# Patient Record
Sex: Female | Born: 1970 | Race: White | Hispanic: No | Marital: Single | Smoking: Heavy tobacco smoker
Health system: Southern US, Academic
[De-identification: ages and names within clinical notes are randomized; demographics above are authoritative.]

## PROBLEM LIST (undated history)

## (undated) HISTORY — PX: LEG SURGERY: SHX1003

## (undated) HISTORY — PX: BREAST LUMPECTOMY: SHX2

## (undated) HISTORY — PX: TUBAL LIGATION: SHX77

## (undated) HISTORY — PX: HX BREAST LUMPECTOMY: SHX2

## (undated) HISTORY — PX: CLOSED REDUCTION TIBIAL FRACTURE: SHX1361

---

## 1999-02-25 ENCOUNTER — Emergency Department (HOSPITAL_COMMUNITY): Admission: EM | Admit: 1999-02-25 | Discharge: 1999-02-25 | Payer: Self-pay | Admitting: Emergency Medicine

## 1999-03-24 ENCOUNTER — Encounter: Payer: Self-pay | Admitting: Emergency Medicine

## 1999-03-24 ENCOUNTER — Emergency Department (HOSPITAL_COMMUNITY): Admission: EM | Admit: 1999-03-24 | Discharge: 1999-03-24 | Payer: Self-pay | Admitting: Emergency Medicine

## 1999-05-14 ENCOUNTER — Encounter: Payer: Self-pay | Admitting: Emergency Medicine

## 1999-05-14 ENCOUNTER — Emergency Department (HOSPITAL_COMMUNITY): Admission: EM | Admit: 1999-05-14 | Discharge: 1999-05-14 | Payer: Self-pay | Admitting: Emergency Medicine

## 1999-05-15 ENCOUNTER — Emergency Department (HOSPITAL_COMMUNITY): Admission: EM | Admit: 1999-05-15 | Discharge: 1999-05-15 | Payer: Self-pay | Admitting: Emergency Medicine

## 1999-05-18 ENCOUNTER — Encounter: Payer: Self-pay | Admitting: Emergency Medicine

## 1999-05-19 ENCOUNTER — Inpatient Hospital Stay (HOSPITAL_COMMUNITY): Admission: EM | Admit: 1999-05-19 | Discharge: 1999-05-25 | Payer: Self-pay | Admitting: Emergency Medicine

## 1999-05-26 ENCOUNTER — Emergency Department (HOSPITAL_COMMUNITY): Admission: EM | Admit: 1999-05-26 | Discharge: 1999-05-26 | Payer: Self-pay | Admitting: Emergency Medicine

## 1999-06-02 ENCOUNTER — Emergency Department (HOSPITAL_COMMUNITY): Admission: EM | Admit: 1999-06-02 | Discharge: 1999-06-02 | Payer: Self-pay | Admitting: Emergency Medicine

## 1999-09-28 ENCOUNTER — Emergency Department (HOSPITAL_COMMUNITY): Admission: EM | Admit: 1999-09-28 | Discharge: 1999-09-28 | Payer: Self-pay | Admitting: Emergency Medicine

## 2000-02-06 ENCOUNTER — Emergency Department (HOSPITAL_COMMUNITY): Admission: EM | Admit: 2000-02-06 | Discharge: 2000-02-07 | Payer: Self-pay | Admitting: Emergency Medicine

## 2000-02-07 ENCOUNTER — Encounter: Payer: Self-pay | Admitting: Emergency Medicine

## 2001-01-26 ENCOUNTER — Emergency Department (HOSPITAL_COMMUNITY): Admission: EM | Admit: 2001-01-26 | Discharge: 2001-01-26 | Payer: Self-pay | Admitting: Emergency Medicine

## 2001-01-27 ENCOUNTER — Encounter: Payer: Self-pay | Admitting: Emergency Medicine

## 2001-03-13 ENCOUNTER — Emergency Department (HOSPITAL_COMMUNITY): Admission: EM | Admit: 2001-03-13 | Discharge: 2001-03-13 | Payer: Self-pay | Admitting: Emergency Medicine

## 2001-03-13 ENCOUNTER — Encounter: Payer: Self-pay | Admitting: Emergency Medicine

## 2003-06-13 ENCOUNTER — Emergency Department (HOSPITAL_COMMUNITY): Admission: EM | Admit: 2003-06-13 | Discharge: 2003-06-13 | Payer: Self-pay | Admitting: Emergency Medicine

## 2003-07-12 ENCOUNTER — Emergency Department (HOSPITAL_COMMUNITY): Admission: EM | Admit: 2003-07-12 | Discharge: 2003-07-12 | Payer: Self-pay | Admitting: Emergency Medicine

## 2003-10-17 ENCOUNTER — Inpatient Hospital Stay (HOSPITAL_COMMUNITY): Admission: EM | Admit: 2003-10-17 | Discharge: 2003-10-21 | Payer: Self-pay | Admitting: Psychiatry

## 2004-01-16 ENCOUNTER — Inpatient Hospital Stay (HOSPITAL_COMMUNITY): Admission: AD | Admit: 2004-01-16 | Discharge: 2004-01-24 | Payer: Self-pay | Admitting: *Deleted

## 2004-05-12 ENCOUNTER — Emergency Department (HOSPITAL_COMMUNITY): Admission: EM | Admit: 2004-05-12 | Discharge: 2004-05-12 | Payer: Self-pay | Admitting: Emergency Medicine

## 2004-05-23 ENCOUNTER — Emergency Department (HOSPITAL_COMMUNITY): Admission: EM | Admit: 2004-05-23 | Discharge: 2004-05-23 | Payer: Self-pay | Admitting: Emergency Medicine

## 2005-01-14 ENCOUNTER — Emergency Department (HOSPITAL_COMMUNITY): Admission: EM | Admit: 2005-01-14 | Discharge: 2005-01-14 | Payer: Self-pay | Admitting: Emergency Medicine

## 2005-01-28 ENCOUNTER — Ambulatory Visit: Payer: Self-pay | Admitting: Psychiatry

## 2005-01-28 ENCOUNTER — Inpatient Hospital Stay (HOSPITAL_COMMUNITY): Admission: RE | Admit: 2005-01-28 | Discharge: 2005-02-01 | Payer: Self-pay | Admitting: Psychiatry

## 2009-03-01 ENCOUNTER — Emergency Department (HOSPITAL_COMMUNITY): Admission: EM | Admit: 2009-03-01 | Discharge: 2009-03-01 | Payer: Self-pay | Admitting: Emergency Medicine
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
## 2011-04-02 NOTE — Discharge Summary (Signed)
NAME:  Danielle Morales, Danielle Morales                     ACCOUNT NO.:  [id number]   MEDICAL RECORD NO.:  [id number]                   PATIENT TYPE:  IPS   LOCATION:  0503                                 FACILITY:  [facility]   PHYSICIAN:  Geoffery Lyons, M.D.                   DATE OF BIRTH:  1971/08/26   DATE OF ADMISSION:  10/17/2003  DATE OF DISCHARGE:  10/21/2003                                 DISCHARGE SUMMARY   CHIEF COMPLAINT AND PRESENTING ILLNESS:  This was the first admission to  [facility]  for this 40 year old white female, single,  voluntarily admitted.  Seven months pregnant.  Suicidal thoughts with a plan  to hang herself.  History of prior attempts.  History of drug use since age  26, longest clean and sober 11 years.  Relapsed 2-1/2 years ago, only using  cocaine.  Had given up her children to [employer] in [place].  Now using cocaine  daily, with escalation since February 2004, $100 a day.  Feeling hopeless,  suicidal, with thoughts of hanging herself. Ruining her life financially and  feeling hopeless about her future.  Endorsed suicidal ideas.  Endorsed  anhedonia.  Feelings of financial stress.   PAST PSYCHIATRIC HISTORY:  [facility] but  noncompliant.  Hospitalized 3-4 times in the past [facility] [place] [facility].  Previously on Zoloft, Depakote, trazodone and Benadryl for  anxiety.   ALCOHOL AND DRUG HISTORY:  History of alcohol and cocaine abuse.   PAST MEDICAL HISTORY:  Pregnant 7 months, due date March 22.  History of  psychosis and transient ischemic attack in 1992 attributed to cocaine.   MEDICATIONS:  Nitrofurantoin 100 mg twice a day, Zoloft, Depakote, trazodone  not taking.   PHYSICAL EXAMINATION:  Performed, failed to show any acute findings other  than edema in her extremities.   MENTAL STATUS EXAM:  Reveals a fully alert female, fair affect, cooperative,  calm, good eye focus.  Normal in pace and tone.  Mood is  depressed and  helpless.  Thought process positive for suicidal ideas, no agitation, no  flight of ideas, no evidence of psychosis.  No homicidal ideas, no paranoia.  Cognition well preserved.   ADMISSION DIAGNOSES:   AXIS I:  1. Major depression, recurrent.  2. Cocaine abuse.   AXIS II:  No diagnosis.   AXIS III:  Seven month pregnancy, acute urinary tract infection.   AXIS IV:  Moderate.   AXIS V:  Global assessment of function upon admission 36, highest global  assessment of function in past year 59.   LABORATORY DATA:  CBC:  Hemoglobin was 11.2.  Blood chemistries were within  normal limits.  Thyroid profile was within normal limits.  Urine culture  negative.   COURSE IN HOSPITAL:  She was admitted and started on intensive individual  and group psychotherapy.  We maintained the Nitrofurantoin 100 mg twice  a  day for 7 days.  We started Zoloft 25 mg per day.  She was given Benadryl  for anxiety.  Long history of depression and mood disorder, having her 6th  child, gave the last one up for adoption, counseling.  Headache, cravings  for cigarettes.  Extensive history of substance use, was still wanting to  have the baby.  Some headaches, __________.  She was given some Midrin.  Apparently had a visit with her boyfriend, was supportive.  Mom was also  supportive.  The fact that they were supportive made her feel better.  She  was willing to work on long-term abstinence, and she started feeling better.  Her mood improved, her affect became brighter, broad.  So on December 6, she  was in full contact with reality.  Mood:  I am happy, dealing much better  with the situation.  Denied any suicidal or homicidal ideas, no plans, no  evidence of hallucinations, no delusions.  Bright, broad, positive about her  life, about the support that she was getting from her family.  Willing and  motivated to pursue further outpatient treatment.   DISCHARGE DIAGNOSES:   AXIS I:  1. Major  depression.  2. Cocaine abuse.   AXIS II:  No diagnosis.   AXIS III:  Intrauterine pregnancy, acute urinary tract infection.   AXIS IV:  Moderate.   AXIS V:  Global assessment of function upon discharge 50-55.   DISCHARGE MEDICATIONS:  1. Prenatal vitamins.  2. Zoloft 50 mg per day.  3. Macrodantin 100 twice a day.  4. Mellaril 25 every 6 hours as needed for anxiety.   DISPOSITION:  Follow up at [facility] clinic, [facility]                                               Geoffery Lyons, M.D.    IL/MEDQ  D:  11/01/2003  T:  11/02/2003  Job:  [id number]

## 2011-04-02 NOTE — Op Note (Signed)
NAME:  Danielle Morales, Danielle Morales                     ACCOUNT NO.:  [id number]   MEDICAL RECORD NO.:  [id number]                   PATIENT TYPE:  INP   LOCATION:  9134                                 FACILITY:  [facility]   PHYSICIAN:  Tanya S. Shawnie Pons, M.D.                DATE OF BIRTH:  10-Aug-1971   DATE OF PROCEDURE:  01/23/2004  DATE OF DISCHARGE:                                 OPERATIVE REPORT   </PREOPERATIVE DIAGNOSES>  1. Multiparity.  2. Undesired fertility.    POSTOPERATIVE DIAGNOSES:  1. Multiparity.  2. Undesired fertility.   PROCEDURE:  Postpartum bilateral tubal ligation with Filshie clips.   SURGEON:  Shelbie Proctor. Shawnie Pons, M.D.   ANESTHESIA:  General endotracheal tube with Raul Del, M.D.   ESTIMATED BLOOD LOSS:  Less than 25 mL.   COMPLICATIONS:  None.   SPECIMENS:  None.   REASON FOR PROCEDURE:  Briefly, the patient is a 40 year old gravida 9, para  6-0-3-6, who is postpartum day 1 from a spontaneous vaginal delivery, whose  pregnancy was complicated by cocaine use and abruption, who desired  permanent sterility.  Prior to the procedure the patient was counseled  regarding risks and benefits of this procedure, including alternatives.  She  was counseled about the permanency of the procedure, risk of failure of one  in 200, increased risk of ectopic should failure occur.  The patient  understood these risks and agreed to proceed.   DESCRIPTION OF PROCEDURE:  The patient was taken to the OR, where she was  placed in the supine position and anesthesia was administered.  She was then  prepped and draped in the usual sterile fashion and 4 mL of 0.25% Marcaine  were injected infraumbilically.  Two Allis clamps were used to elevate prior  to the incision and a 2 cm was made on her infraumbilically.  It was carried  down to the underlying fascia and the peritoneal cavity entered sharply.  [employer] retractors were then used as well as Trendelenburg and a sponge  pad to move  omentum and bowel, which were in the way from finding the tube.  The cornu of the uterus was then grasped with a Babcock clamp and the tube  visualized and grasped and followed to its fimbriated end.  Approximately  1.5 cm from the cornu a Filshie clip was placed across this tube.  Similarly  the right tube was identified and followed to its fimbriated end  approximately 1.5 cm from the cornu.  A Filshie clip was placed across the  tube.  Both tubes were allowed to return  to the abdominal cavity.  The fascia was closed with #1 Vicryl suture in a  running fashion, the skin closed with 4-0 Vicryl in a subcutaneous fashion.  All instrument, needle, and lap counts were correct x2.  The patient was  awakened and taken to the recovery room in stable condition.  Shelbie Proctor. Shawnie Pons, M.D.    TSP/MEDQ  D:  01/23/2004  T:  01/24/2004  Job:  [id number]

## 2011-04-02 NOTE — Discharge Summary (Signed)
NAME:  Danielle Morales, Danielle Morales                     ACCOUNT NO.:  [id number]   MEDICAL RECORD NO.:  [id number]                   PATIENT TYPE:  INP   LOCATION:  9134                                 [facility]ACILITY:  [facility]   PHYSICIAN:  Lesly Dukes, M.D.              DATE OF BIRTH:  Mar 10, 1971   DATE OF ADMISSION:  01/16/2004  DATE OF DISCHARGE:  01/24/2004                                 DISCHARGE SUMMARY   ADMISSION DIAGNOSIS:  Vaginal bleeding.   HISTORY OF PRESENT ILLNESS:  This is a 40 year old G9 P6-0-3-6 white female  at 61 and two-sevenths estimated gestational age by last menstrual period  who is admitted for observation after presenting with vaginal bleeding and a  positive urine drug screen for cocaine.  Complaint of severe abdominal pain  and suspicion for marginal placental separation kept the patient in-house  for a number of days for observation.  In addition there was concern that  sending the patient from the hospital would lead to further drug use and  ultimately in demise of the unborn child.  The patient had a workup for GI  pathology given the complaint of abdominal pain.  This workup was negative.  The patient also had a psychiatry consult given her positive past medical  history of bipolar disorder and suicidal ideation.  The patient was seen by  Dr. Antonietta Breach who diagnosed the patient with DSM-IV axis I diagnosis  of bipolar I disorder, depression in remission, and polysubstance  dependence.  He recommended daily use of Zoloft and Depakote and p.r.n. use  of trazodone after delivery for insomnia.  The patient's LMP was uncertain  and as such we were struggling with the patient's actual dating.  Amniocentesis was attempted to assess fetal lung maturity but once the  patient was on the table was felt to be too high risk, therefore was not  performed.  On hospital day #6 the patient complained of contractions and  progressed rapidly to spontaneous vaginal delivery of  a female with Apgars  of 3 and 9 at one and five minutes respectively.  A spontaneous, intact,  three-vessel-cord placenta was delivered shortly thereafter.  On postpartum  day #1 the patient had a laparoscopic bilateral tubal ligation without  complication.  On hospital day #7 the patient met with the Department of  Social Services regarding her history of drug abuse and her psychiatric  history as it related to the safety of the newborn.  It was determined that  the patient may return home with the child as long as the patient's sister  was directly involved in the newborn's care.  In addition, the Department of  Social Services would be making at least weekly visits to the house and the  patient will have to check in at least twice weekly with the Department of  Social Services to ensure consistent and healthy parenting.  The patient may  also be subject to random  drug testing.  On the day of discharge the patient  complained of shortness of air and calf pain, had slight reduced breath  sounds in the right lung field. Given the patient's history of being  bedridden for a week, along with history of smoking and recent pregnancy, a  spiral CT was performed to rule out CT.  This was negative.  The patient  also complained of dysuria on the day of discharge, had a UA which was read  as specific gravity 1.020, pH 6.5, blood large, nitrite positive, few  squamous cells, 3-6 white blood cells per high-power field, many bacteria  per high-power field.  The patient was given a prescription for Macrobid 100  mg p.o. b.i.d. x7 days.  The patient was also given ibuprofen 600 mg p.o.  q.6h. p.r.n. for pain.  Hemoglobin on postpartum day #1 was 10.4.  The  patient is rubella immune, A positive, bottle feeding.  Was to follow up in  6 weeks at [facility].     Franklyn Lor, MD                            Lesly Dukes, M.D.    TD/MEDQ  D:  01/24/2004  T:  01/25/2004  Job:  [id number]
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
## 2016-12-06 ENCOUNTER — Emergency Department (HOSPITAL_COMMUNITY)
Admission: EM | Admit: 2016-12-06 | Discharge: 2016-12-06 | Disposition: A | Discharge status: Home / Self Care | Payer: Self-pay | Attending: Emergency Medicine | Admitting: Emergency Medicine

## 2016-12-06 ENCOUNTER — Encounter (HOSPITAL_COMMUNITY): Payer: Self-pay | Admitting: Emergency Medicine

## 2016-12-06 DIAGNOSIS — G8929 Other chronic pain: Secondary | ICD-10-CM

## 2016-12-06 DIAGNOSIS — R51 Headache: Secondary | ICD-10-CM | POA: Insufficient documentation

## 2016-12-06 DIAGNOSIS — M546 Pain in thoracic spine: Secondary | ICD-10-CM | POA: Insufficient documentation

## 2016-12-06 DIAGNOSIS — F17210 Nicotine dependence, cigarettes, uncomplicated: Secondary | ICD-10-CM | POA: Insufficient documentation

## 2016-12-06 DIAGNOSIS — R519 Headache, unspecified: Secondary | ICD-10-CM

## 2016-12-06 DIAGNOSIS — M549 Dorsalgia, unspecified: Secondary | ICD-10-CM

## 2016-12-06 MED ORDER — KETOROLAC TROMETHAMINE 60 MG/2ML IM SOLN
60.0000 mg | Freq: Once | INTRAMUSCULAR | Status: AC
  Administered 2016-12-06: 60 mg via INTRAMUSCULAR
  Filled 2016-12-06: qty 2

## 2016-12-06 MED ORDER — PROCHLORPERAZINE MALEATE 10 MG PO TABS
10.0000 mg | ORAL_TABLET | Freq: Once | ORAL | Status: AC
  Administered 2016-12-06: 10 mg via ORAL
  Filled 2016-12-06: qty 1

## 2016-12-06 MED ORDER — DIPHENHYDRAMINE HCL 25 MG PO CAPS
25.0000 mg | ORAL_CAPSULE | Freq: Once | ORAL | Status: AC
  Administered 2016-12-06: 25 mg via ORAL
  Filled 2016-12-06: qty 1

## 2016-12-06 NOTE — Discharge Instructions (Signed)
Read the information below.  Use the prescribed medication as directed.  Please discuss all new medications with your pharmacist.  You may return to the Emergency Department at any time for worsening condition or any new symptoms that concern you.   ° °You are having a headache. No specific cause was found today for your headache. It may have been a migraine or other cause of headache. Stress, anxiety, fatigue, and depression are common triggers for headaches. Your headache today does not appear to be life-threatening or require hospitalization, but often the exact cause of headaches is not determined in the emergency department. Therefore, follow-up with your doctor is very important to find out what may have caused your headache, and whether or not you need any further diagnostic testing or treatment. Sometimes headaches can appear benign (not harmful), but then more serious symptoms can develop which should prompt an immediate re-evaluation by your doctor or the emergency department. °SEEK MEDICAL ATTENTION IF: °You develop possible problems with medications prescribed.  °The medications don't resolve your headache, if it recurs , or if you have multiple episodes of vomiting or can't take fluids. °You have a change from the usual headache. °RETURN IMMEDIATELY IF you develop a sudden, severe headache or confusion, become poorly responsive or faint, develop a fever above 100.4F or problem breathing, have a change in speech, vision, swallowing, or understanding, or develop new weakness, numbness, tingling, incoordination, or have a seizure. °

## 2016-12-06 NOTE — ED Triage Notes (Addendum)
Pt verbalizes chronic left shoulder pain with acute chronic onset of pain last night at work. Pt continues to report headache onset yesterday. Pt denies CP or SOB.

## 2016-12-06 NOTE — ED Notes (Signed)
No response when called from lobby for triage. 

## 2016-12-06 NOTE — ED Provider Notes (Signed)
[facility]-EMERGENCY DEPT Provider Note   CSN: [id number] Arrival date & time: 12/06/16  1416   By signing my name below, I, Clovis PuAvnee Patel, attest that this documentation has been prepared under the direction and in the presence of  [facility] Center[facility] Lamonta Cypress, PA-C. Electronically Signed: Clovis PuAvnee Patel, ED Scribe. 12/06/16. 3:26 PM.   History   Chief Complaint Chief Complaint  Patient presents with  . Headache  . Shoulder Pain   The history is provided by the patient. No language interpreter was used.   HPI Comments:  Danielle Morales is a 46 y.o. female, with a hx of chronic left upper back pain, who presents to the Emergency Department complaining of a bitemporal headache that began yesterday with associated photophobia, phonophobia.  This feels like prior migraines.  She has also worsening left upper back pain that began in November when she did heavy lifting while moving.  She had a few episodes of nosebleed from the left nare today, each resolved within 10 minutes.  She does not usually have nosebleeds.  She is not on blood thinners.  Denies nasal congestion or sinus pressure.  No alleviating factors noted. Pt denies daily medication use, any recent head trauma, fevers, recent illnesses, congestion, sore throat and any other associated symptoms at this time. No PCP noted. She is allergic to ciprofloxacin.   History reviewed. No pertinent past medical history.  There are no active problems to display for this patient.   Past Surgical History:  Procedure Laterality Date  . BREAST LUMPECTOMY    . LEG SURGERY      OB History    No data available       Home Medications    Prior to Admission medications   Not on File    Family History No family history on file.  Social History Social History  Substance Use Topics  . Smoking status: Current Every Day Smoker    Types: Cigarettes  . Smokeless tobacco: Never Used  . Alcohol use No     Allergies   Ciprofloxacin   Review of  Systems Review of Systems  Constitutional: Negative for fever.  HENT: Positive for nosebleeds. Negative for congestion, facial swelling, sore throat and trouble swallowing.   Eyes: Positive for photophobia.  Musculoskeletal: Positive for neck pain.  Allergic/Immunologic: Negative for immunocompromised state.  Neurological: Positive for headaches.  Hematological: Does not bruise/bleed easily.  Psychiatric/Behavioral: Negative for self-injury.     Physical Exam Updated Vital Signs BP 139/69   Pulse 80   Temp 98 F (36.7 C) (Oral)   Resp 16   LMP 12/06/2016   SpO2 97%   Physical Exam  Constitutional: She appears well-developed and well-nourished. No distress.  HENT:  Head: Normocephalic and atraumatic.  Nose: Mucosal edema present. No epistaxis.  Left anterior nare over septum with superficial vessel that appears to have recently bled.    Neck: Normal range of motion. Neck supple.  Pulmonary/Chest: Effort normal.  Musculoskeletal: She exhibits tenderness.  Left cervical spinal and trapezius muscle TTP. No skin changes and no focal bony tenderness   Neurological: She is alert.  CN II-XII intact, EOMs intact, no pronator drift, grip strengths equal bilaterally; strength 5/5 in all extremities, sensation intact in all extremities; finger to nose, heel to shin, rapid alternating movements normal; gait is normal.   Skin: She is not diaphoretic.  Nursing note and vitals reviewed.    ED Treatments / Results  DIAGNOSTIC STUDIES:  Oxygen Saturation is 97% on RA, normal by  my interpretation.    COORDINATION OF CARE:  3:23 PM Discussed treatment plan with pt at bedside and pt agreed to plan.  Labs (all labs ordered are listed, but only abnormal results are displayed) Labs Reviewed - No data to display  EKG  EKG Interpretation None       Radiology No results found.  Procedures Procedures (including critical care time)  Medications Ordered in ED Medications   ketorolac (TORADOL) injection 60 mg (60 mg Intramuscular Given 12/06/16 1559)  prochlorperazine (COMPAZINE) tablet 10 mg (10 mg Oral Given 12/06/16 1600)  diphenhydrAMINE (BENADRYL) capsule 25 mg (25 mg Oral Given 12/06/16 1600)     Initial Impression / Assessment and Plan / ED Course  I have reviewed the triage vital signs and the nursing notes.  Pertinent labs & imaging results that were available during my care of the patient were reviewed by me and considered in my medical decision making (see chart for details).  Clinical Course as of Dec 06 1724  Mon Dec 06, 2016  1706 Pt reports great improvement of headache.    [EW]    Clinical Course User Index [EW] Trixie Dredge, PA-C    Pt HA treated and improved while in ED.  Presentation is like pts typical migraine HA exacerbated by left upper back and neck pain from muscular injury with heavy lifting in November.  Headache without red flags, not concerning for [facility], ICH, Meningitis, or temporal arteritis. Pt is afebrile with no focal neuro deficits, nuchal rigidity, or change in vision. Pt is to follow up with PCP.  Pt verbalizes understanding and is agreeable with plan to dc.   Discussed result, findings, treatment, and follow up  with patient.  Pt given return precautions.  Pt verbalizes understanding and agrees with plan.       Final Clinical Impressions(s) / ED Diagnoses   Final diagnoses:  Acute nonintractable headache, unspecified headache type  Chronic upper back pain    New Prescriptions There are no discharge medications for this patient.   I personally performed the services described in this documentation, which was scribed in my presence. The recorded information has been reviewed and is accurate.     Trixie Dredge, PA-C 12/06/16 1726    Derwood Kaplan, MD 12/07/16 1551

## 2017-04-05 ENCOUNTER — Encounter (HOSPITAL_COMMUNITY): Payer: Self-pay | Admitting: Emergency Medicine

## 2017-04-05 ENCOUNTER — Emergency Department (HOSPITAL_COMMUNITY): Payer: Self-pay

## 2017-04-05 ENCOUNTER — Other Ambulatory Visit: Payer: Self-pay

## 2017-04-05 ENCOUNTER — Emergency Department (HOSPITAL_COMMUNITY)
Admission: EM | Admit: 2017-04-05 | Discharge: 2017-04-05 | Disposition: A | Discharge status: Home / Self Care | Payer: Self-pay | Attending: Physician Assistant | Admitting: Physician Assistant

## 2017-04-05 DIAGNOSIS — N390 Urinary tract infection, site not specified: Secondary | ICD-10-CM | POA: Insufficient documentation

## 2017-04-05 DIAGNOSIS — R739 Hyperglycemia, unspecified: Secondary | ICD-10-CM | POA: Insufficient documentation

## 2017-04-05 DIAGNOSIS — R1013 Epigastric pain: Secondary | ICD-10-CM

## 2017-04-05 DIAGNOSIS — Z79899 Other long term (current) drug therapy: Secondary | ICD-10-CM | POA: Insufficient documentation

## 2017-04-05 DIAGNOSIS — K920 Hematemesis: Secondary | ICD-10-CM | POA: Insufficient documentation

## 2017-04-05 DIAGNOSIS — F17210 Nicotine dependence, cigarettes, uncomplicated: Secondary | ICD-10-CM | POA: Insufficient documentation

## 2017-04-05 LAB — COMPREHENSIVE METABOLIC PANEL
ALBUMIN: 3.7 g/dL (ref 3.5–5.0)
ALK PHOS: 71 U/L (ref 38–126)
ALT: 23 U/L (ref 14–54)
ANION GAP: 9 (ref 5–15)
AST: 20 U/L (ref 15–41)
BUN: 9 mg/dL (ref 6–20)
CALCIUM: 8.9 mg/dL (ref 8.9–10.3)
CHLORIDE: 104 mmol/L (ref 101–111)
CO2: 25 mmol/L (ref 22–32)
CREATININE: 0.73 mg/dL (ref 0.44–1.00)
GFR calc non Af Amer: >60 mL/min (ref >60)
GLUCOSE: 145 mg/dL — AB (ref 65–99)
Potassium: 4 mmol/L (ref 3.5–5.1)
SODIUM: 138 mmol/L (ref 135–145)
Total Bilirubin: 0.4 mg/dL (ref 0.3–1.2)
Total Protein: 7.1 g/dL (ref 6.5–8.1)

## 2017-04-05 LAB — CBC
HEMATOCRIT: 43.7 % (ref 36.0–46.0)
HEMOGLOBIN: 14.4 g/dL (ref 12.0–15.0)
MCH: 29.3 pg (ref 26.0–34.0)
MCHC: 33 g/dL (ref 30.0–36.0)
MCV: 88.8 fL (ref 78.0–100.0)
Platelets: 304 10*3/uL (ref 150–400)
RBC: 4.92 MIL/uL (ref 3.87–5.11)
RDW: 13.9 % (ref 11.5–15.5)
WBC: 8 10*3/uL (ref 4.0–10.5)

## 2017-04-05 LAB — URINALYSIS, ROUTINE W REFLEX MICROSCOPIC
BILIRUBIN URINE: NEGATIVE
GLUCOSE, UA: NEGATIVE mg/dL
HGB URINE DIPSTICK: NEGATIVE
Ketones, ur: NEGATIVE mg/dL
NITRITE: NEGATIVE
PROTEIN: NEGATIVE mg/dL
Specific Gravity, Urine: 1.019 (ref 1.005–1.030)
pH: 7 (ref 5.0–8.0)

## 2017-04-05 LAB — POCT I-STAT TROPONIN I: Troponin i, poc: 0 ng/mL (ref 0.00–0.08)

## 2017-04-05 LAB — OCCULT BLOOD, POC DEVICE: Fecal Occult Bld: NEGATIVE

## 2017-04-05 LAB — LIPASE, BLOOD: LIPASE: 24 U/L (ref 11–51)

## 2017-04-05 MED ORDER — SODIUM CHLORIDE 0.9 % IV BOLUS (SEPSIS)
1000.0000 mL | Freq: Once | INTRAVENOUS | Status: AC
  Administered 2017-04-05: 1000 mL via INTRAVENOUS

## 2017-04-05 MED ORDER — SULFAMETHOXAZOLE-TRIMETHOPRIM 800-160 MG PO TABS: 1.0000 | ORAL_TABLET | Freq: Two times a day (BID) | ORAL | 0 refills | Status: AC

## 2017-04-05 MED ORDER — DEXTROSE 5 % IV SOLN
1.0000 g | Freq: Once | INTRAVENOUS | Status: AC
  Administered 2017-04-05: 1 g via INTRAVENOUS
  Filled 2017-04-05: qty 10

## 2017-04-05 MED ORDER — SUCRALFATE 1 G PO TABS: 1.0000 g | ORAL_TABLET | Freq: Three times a day (TID) | ORAL | 0 refills | Status: AC

## 2017-04-05 MED ORDER — METOCLOPRAMIDE HCL 10 MG PO TABS: 10.0000 mg | ORAL_TABLET | Freq: Three times a day (TID) | ORAL | 0 refills | Status: AC | PRN

## 2017-04-05 MED ORDER — PANTOPRAZOLE SODIUM 20 MG PO TBEC: 20.0000 mg | DELAYED_RELEASE_TABLET | Freq: Every day | ORAL | 0 refills | Status: AC

## 2017-04-05 MED ORDER — PANTOPRAZOLE SODIUM 40 MG IV SOLR
40.0000 mg | Freq: Once | INTRAVENOUS | Status: AC
  Administered 2017-04-05: 40 mg via INTRAVENOUS
  Filled 2017-04-05: qty 40

## 2017-04-05 MED ORDER — ONDANSETRON HCL 4 MG/2ML IJ SOLN
4.0000 mg | Freq: Once | INTRAMUSCULAR | Status: AC
  Administered 2017-04-05: 4 mg via INTRAVENOUS
  Filled 2017-04-05: qty 2

## 2017-04-05 MED ORDER — SUCRALFATE 1 G PO TABS
1.0000 g | ORAL_TABLET | Freq: Once | ORAL | Status: AC
  Administered 2017-04-05: 1 g via ORAL
  Filled 2017-04-05: qty 1

## 2017-04-05 MED ORDER — GI COCKTAIL ~~LOC~~
30.0000 mL | Freq: Once | ORAL | Status: AC
  Administered 2017-04-05: 30 mL via ORAL
  Filled 2017-04-05: qty 30

## 2017-04-05 NOTE — ED Provider Notes (Signed)
[facility]-EMER[facility]ENCY DEPT Provider Note   CSN: [id number] Arrival date & time: 04/05/17  1536     History   Chief Complaint Chief Complaint  Patient presents with  . Abdominal Pain    HPI Danielle Morales is a 46 y.o. female.  HPI   Patient p/w sharp epigastric pain with radiation through to the back that began at 2am today.  Associated nausea.  EMS was called but she refused transport.  Used pressure, movement, meditation with mild improvement.  She slept, woke up and ate - vomited BRB blood with clots for 6-7 minutes. She denies any recent dark stools but does have them occasionally, never BRBPR.  Has had some associated lightheadedness. Denies fevers, CP, SOB, cough, urinary or vaginal symptoms.  Has never had abdominal surgery.  Does not have a PCP.  Does not take any medications, only occasional tylenol.  Occasional ETOH.     History reviewed. No pertinent past medical history.  There are no active problems to display for this patient.   Past Surgical History:  Procedure Laterality Date  . BREAST LUMPECTOMY    . LEG SURGERY      OB History    No data available       Home Medications    Prior to Admission medications   Medication Sig Start Date End Date Taking? Authorizing Provider  metoCLOPramide (REGLAN) 10 MG tablet Take 1 tablet (10 mg total) by mouth every 8 (eight) hours as needed for nausea or vomiting. 04/05/17   Trixie Dredge, PA-C  pantoprazole (PROTONIX) 20 MG tablet Take 1 tablet (20 mg total) by mouth daily. 04/05/17   Trixie Dredge, PA-C  sucralfate (CARAFATE) 1 g tablet Take 1 tablet (1 g total) by mouth 4 (four) times daily -  with meals and at bedtime. 04/05/17   Trixie Dredge, PA-C  sulfamethoxazole-trimethoprim (BACTRIM DS,SEPTRA DS) 800-160 MG tablet Take 1 tablet by mouth 2 (two) times daily. X 3 days 04/05/17   Trixie Dredge, PA-C    Family History No family history on file.  Social History Social History  Substance Use Topics  . Smoking status:  Current Every Day Smoker    Types: Cigarettes  . Smokeless tobacco: Never Used  . Alcohol use No     Allergies   Ciprofloxacin   Review of Systems Review of Systems  All other systems reviewed and are negative.    Physical Exam Updated Vital Signs BP 134/85 (BP Location: Left Arm)   Pulse 70   Temp 97.9 F (36.6 C) (Oral)   Resp 18   LMP 03/16/2017 (Exact Date)   SpO2 98%   Physical Exam  Constitutional: She appears well-developed and well-nourished. No distress.  HENT:  Head: Normocephalic and atraumatic.  Neck: Neck supple.  Cardiovascular: Normal rate and regular rhythm.   Pulmonary/Chest: Effort normal and breath sounds normal. No respiratory distress. She has no wheezes. She has no rales.  Abdominal: Soft. She exhibits no distension. There is tenderness (epigastric). There is no rebound and no guarding.  Neurological: She is alert.  Skin: She is not diaphoretic.  Nursing note and vitals reviewed.    ED Treatments / Results  Labs (all labs ordered are listed, but only abnormal results are displayed) Labs Reviewed  COMPREHENSIVE METABOLIC PANEL - Abnormal; Notable for the following:       Result Value   Glucose, Bld 145 (*)    All other components within normal limits  URINALYSIS, ROUTINE W REFLEX MICROSCOPIC - Abnormal; Notable for  the following:    APPearance HAZY (*)    Leukocytes, UA TRACE (*)    Bacteria, UA MANY (*)    Squamous Epithelial / LPF 0-5 (*)    All other components within normal limits  URINE CULTURE  LIPASE, BLOOD  CBC  I-STAT TROPOININ, ED  POC OCCULT BLOOD, ED  OCCULT BLOOD, POC DEVICE  POCT I-STAT TROPONIN I    EKG  EKG Interpretation None       Radiology Dg Chest 2 View  Result Date: 04/05/2017 CLINICAL DATA:  Chest pain EXAM: CHEST  2 VIEW COMPARISON:  05/26/2016 FINDINGS: The heart size and mediastinal contours are within normal limits. Both lungs are clear. The visualized skeletal structures are unremarkable.  IMPRESSION: No active cardiopulmonary disease. Electronically Signed   By: Marlan Palauharles  Clark M.D.   On: 04/05/2017 16:52   [place] Abdomen Complete  Result Date: 04/05/2017 CLINICAL DATA:  Upper abdominal pain.  Hematemesis EXAM: ABDOMEN ULTRASOUND COMPLETE COMPARISON:  None. FINDINGS: Gallbladder: No gallstones or wall thickening visualized. No sonographic Murphy sign noted by sonographer. Common bile duct: Diameter: 2 mm Liver: Increased echogenicity liver without focal lesion IVC: No abnormality visualized. Pancreas: Visualized portion unremarkable. Spleen: Size and appearance within normal limits. Right Kidney: Length: 13.3 cm. Echogenicity within normal limits. No mass or hydronephrosis visualized. Left Kidney: Length: 14.3 cm. Echogenicity within normal limits. No mass or hydronephrosis visualized. Abdominal aorta: No aneurysm visualized. Other findings: None. IMPRESSION: Negative for gallstones.  No acute abnormality. Increased echogenicity liver without focal liver lesion. Electronically Signed   By: Marlan Palauharles  Clark M.D.   On: 04/05/2017 18:56    Procedures Procedures (including critical care time)  Medications Ordered in ED Medications  sodium chloride 0.9 % bolus 1,000 mL (0 mLs Intravenous Stopped 04/05/17 2009)  pantoprazole (PROTONIX) injection 40 mg (40 mg Intravenous Given 04/05/17 1752)  gi cocktail (Maalox,Lidocaine,Donnatal) (30 mLs Oral Given 04/05/17 1752)  ondansetron (ZOFRAN) injection 4 mg (4 mg Intravenous Given 04/05/17 1752)  cefTRIAXone (ROCEPHIN) 1 g in dextrose 5 % 50 mL IVPB (0 g Intravenous Stopped 04/05/17 2022)  sucralfate (CARAFATE) tablet 1 g (1 g Oral Given 04/05/17 1921)     Initial Impression / Assessment and Plan / ED Course  I have reviewed the triage vital signs and the nursing notes.  Pertinent labs & imaging results that were available during my care of the patient were reviewed by me and considered in my medical decision making (see chart for details).  Clinical  Course as of Apr 05 2302  Tue Apr 05, 2017  1959 Pt feeling better, tolerating PO.    [EW]    Clinical Course User Index [EW] [place], Danner Paul[place], [place]PA-C   Afebrile, nontoxic patient with epigastric pain episode of N/V with hematemesis.  Hemoccult stool negative.  No further vomiting.  UA appears infected.  Labs demonstrated hyperglycemia, no other significant abnormalities.  Pt feeling better after treatment in ED.  US unremarkable.  CXR negative.  Workup reassuring.   Suspect UTI is unrelated, pt states she gets frequent UTIs.  More likely PUD.  Discussed strict return precautions with patient.   D/C home with reglan, protonix, carafate, GI follow up.  Discussed result, findings, treatment, and follow up  with patient.  Pt given return precautions.  Pt verbalizes understanding and agrees with plan.       Final Clinical Impressions(s) / ED Diagnoses   Final diagnoses:  Lower urinary tract infectious disease  Hyperglycemia  Epigastric pain  Hematemesis with nausea  New Prescriptions Discharge Medication List as of 04/05/2017  8:05 PM    START taking these medications   Details  metoCLOPramide (REGLAN) 10 MG tablet Take 1 tablet (10 mg total) by mouth every 8 (eight) hours as needed for nausea or vomiting., Starting Tue 04/05/2017, Print    pantoprazole (PROTONIX) 20 MG tablet Take 1 tablet (20 mg total) by mouth daily., Starting Tue 04/05/2017, Print    sucralfate (CARAFATE) 1 g tablet Take 1 tablet (1 g total) by mouth 4 (four) times daily -  with meals and at bedtime., Starting Tue 04/05/2017, Print    sulfamethoxazole-trimethoprim (BACTRIM DS,SEPTRA DS) 800-160 MG tablet Take 1 tablet by mouth 2 (two) times daily. X 3 days, Starting Tue 04/05/2017, Print         Trixie Dredge, [place] 04/05/17 2303    Trixie Dredge, Cordelia Poche 04/05/17 2309    Abelino Derrick, MD 04/05/17 [phone]

## 2017-04-05 NOTE — ED Triage Notes (Signed)
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

## 2017-04-05 NOTE — Discharge Instructions (Signed)
Read the information below.  Use the prescribed medication as directed.  Please discuss all new medications with your pharmacist.  You may return to the Emergency Department at any time for worsening condition or any new symptoms that concern you.     If you develop high fevers, worsening abdominal pain, uncontrolled vomiting, recurrent bright red blood in your vomit or from your rectum, or are unable to tolerate fluids by mouth, return to the ER for a recheck.

## 2017-04-08 LAB — URINE CULTURE: Culture: >=100000 — AB

## 2017-04-09 ENCOUNTER — Telehealth: Payer: Self-pay

## 2017-04-09 NOTE — Telephone Encounter (Signed)
Post ED Visit - Positive Culture Follow-up  Culture report reviewed by antimicrobial stewardship pharmacist:  []  Danielle Morales, Pharm.D. []  Danielle Morales, Pharm.D., BCPS AQ-ID []  Danielle Morales, Pharm.D., BCPS []  Danielle Morales, [address]Ph[address] BCPS []  [place] Hills[place]h Morales, [address]Pharm[address]D., BCPS, AAHIVP []  Danielle Morales, Pharm.D., BCPS, AAHIVP [x]  Danielle Morales, PharmD, BCPS []  Danielle Morales, PharmD, BCPS []  Danielle Morales, PharmD, BCPS  Positive urine culture Treated with Sulfamethoxazole, organism sensitive to the same and no further patient follow-up is required at this time.  Danielle Morales, Danielle Morales 04/09/2017, 9:51 AM

## 2017-07-21 ENCOUNTER — Emergency Department (HOSPITAL_COMMUNITY)
Admission: EM | Admit: 2017-07-21 | Discharge: 2017-07-21 | Disposition: A | Discharge status: Home / Self Care | Payer: Self-pay | Attending: Emergency Medicine | Admitting: Emergency Medicine

## 2017-07-21 ENCOUNTER — Encounter (HOSPITAL_COMMUNITY): Payer: Self-pay | Admitting: Emergency Medicine

## 2017-07-21 ENCOUNTER — Emergency Department (HOSPITAL_COMMUNITY): Payer: Self-pay

## 2017-07-21 DIAGNOSIS — W010XXA Fall on same level from slipping, tripping and stumbling without subsequent striking against object, initial encounter: Secondary | ICD-10-CM | POA: Insufficient documentation

## 2017-07-21 DIAGNOSIS — Y939 Activity, unspecified: Secondary | ICD-10-CM | POA: Insufficient documentation

## 2017-07-21 DIAGNOSIS — F17210 Nicotine dependence, cigarettes, uncomplicated: Secondary | ICD-10-CM | POA: Insufficient documentation

## 2017-07-21 DIAGNOSIS — Z79899 Other long term (current) drug therapy: Secondary | ICD-10-CM | POA: Insufficient documentation

## 2017-07-21 DIAGNOSIS — Y999 Unspecified external cause status: Secondary | ICD-10-CM | POA: Insufficient documentation

## 2017-07-21 DIAGNOSIS — M25561 Pain in right knee: Secondary | ICD-10-CM | POA: Insufficient documentation

## 2017-07-21 DIAGNOSIS — Y929 Unspecified place or not applicable: Secondary | ICD-10-CM | POA: Insufficient documentation

## 2017-07-21 MED ORDER — IBUPROFEN 600 MG PO TABS: 600.0000 mg | ORAL_TABLET | Freq: Four times a day (QID) | ORAL | 0 refills | Status: AC | PRN

## 2017-07-21 MED ORDER — IBUPROFEN 200 MG PO TABS
600.0000 mg | ORAL_TABLET | Freq: Once | ORAL | Status: AC
  Administered 2017-07-21: 600 mg via ORAL
  Filled 2017-07-21: qty 3

## 2017-07-21 MED ORDER — OXYCODONE-ACETAMINOPHEN 5-325 MG PO TABS
1.0000 | ORAL_TABLET | Freq: Once | ORAL | Status: AC
Start: 2017-07-21 — End: 2017-07-21
  Administered 2017-07-21: 1 via ORAL
  Filled 2017-07-21: qty 1

## 2017-07-21 NOTE — ED Notes (Signed)
Pt states that she changed her mind and wants crutches.

## 2017-07-21 NOTE — ED Triage Notes (Addendum)
Pt reports she slipped on some plastic this afternoon. Fell on R knee. Pt reports R knee pain. Pt has rod in this leg from past tib/fib injury. Pt reports she has pain in her R foot, normally has no sensation in this foot. Pt ambulatory. Pain with movement

## 2017-07-21 NOTE — ED Notes (Signed)
Pt ambulatory and independent at discharge.  Verbalized understanding of discharge instructions and importance of not driving while taking pain medication.

## 2017-07-21 NOTE — ED Provider Notes (Signed)
[facility]-EMERGENCY DEPT Provider Note   CSN: [id number] Arrival date & time: 07/21/17  1625     History   Chief Complaint Chief Complaint  Patient presents with  . Knee Injury    HPI Danielle Morales is a 46 y.o. female.  HPI  46 y.o. female, presents to the Emergency Department today due to mechanical fall on plastic this afternoon. Pt states that she fell onto right knee and a strange angle. Notes worsening pain from previous injury in lower leg. States that she had tib/fib repair with rod placed. Also noted pain to right foot on top. States majority of pain in knee. Rates 10/10. Throbbing. Pain with extension. Pain with ambulation. Abrasion noted on anterior aspect. No numbness/tingling that is changed from previous surgery. No swelling. No meds PTA. No other symptoms noted.    History reviewed. No pertinent past medical history.  There are no active problems to display for this patient.   Past Surgical History:  Procedure Laterality Date  . BREAST LUMPECTOMY    . LEG SURGERY      OB History    No data available       Home Medications    Prior to Admission medications   Medication Sig Start Date End Date Taking? Authorizing Provider  metoCLOPramide (REGLAN) 10 MG tablet Take 1 tablet (10 mg total) by mouth every 8 (eight) hours as needed for nausea or vomiting. 04/05/17   Trixie DredgeWest, Emily, PA-C  pantoprazole (PROTONIX) 20 MG tablet Take 1 tablet (20 mg total) by mouth daily. 04/05/17   Trixie DredgeWest, Emily, PA-C  sucralfate (CARAFATE) 1 g tablet Take 1 tablet (1 g total) by mouth 4 (four) times daily -  with meals and at bedtime. 04/05/17   Trixie DredgeWest, Emily, PA-C  sulfamethoxazole-trimethoprim (BACTRIM DS,SEPTRA DS) 800-160 MG tablet Take 1 tablet by mouth 2 (two) times daily. X 3 days 04/05/17   Trixie DredgeWest, Emily, PA-C    Family History History reviewed. No pertinent family history.  Social History Social History  Substance Use Topics  . Smoking status: Current Every Day Smoker    Types:  Cigarettes  . Smokeless tobacco: Never Used  . Alcohol use No     Allergies   Ciprofloxacin   Review of Systems Review of Systems ROS reviewed and all are negative for acute change except as noted in the HPI.  Physical Exam Updated Vital Signs BP 125/77 (BP Location: Left Arm)   Pulse 98   Temp 98.4 F (36.9 C) (Oral)   Resp 18   LMP 07/07/2017 (Within Weeks)   SpO2 100%   Physical Exam  Constitutional: She is oriented to person, place, and time. Vital signs are normal. She appears well-developed and well-nourished.  HENT:  Head: Normocephalic and atraumatic.  Right Ear: Hearing normal.  Left Ear: Hearing normal.  Eyes: Pupils are equal, round, and reactive to light. Conjunctivae and EOM are normal.  Neck: Normal range of motion. Neck supple.  Cardiovascular: Normal rate, regular rhythm, normal heart sounds and intact distal pulses.   Pulmonary/Chest: Effort normal.  Musculoskeletal: Normal range of motion.  Right Knee TTP along anterior aspect with superficial abrasions noted. Bleeding controlled. Pain with flexion/extension. TTP along LCL. No swelling. No erythema. No palpable/visible deformities along Tib/Fib. Ankle ROM intact. TTP along dorsum of foot along proximal MTPs. NVI.   Neurological: She is alert and oriented to person, place, and time.  Skin: Skin is warm and dry.  Psychiatric: She has a normal mood and affect. Her speech  is normal and behavior is normal. Thought content normal.  Nursing note and vitals reviewed.  ED Treatments / Results  Labs (all labs ordered are listed, but only abnormal results are displayed) Labs Reviewed - No data to display  EKG  EKG Interpretation None       Radiology Dg Ankle Complete Right  Result Date: 07/21/2017 CLINICAL DATA:  Pain after fall EXAM: RIGHT ANKLE - COMPLETE 3+ VIEW COMPARISON:  None. FINDINGS: A rod is seen in the tibia. Expansion of the mid tibia and fibula, only partially visualized, is consistent with  healed remote fracture. A contour abnormality in the soft tissues of the lower leg laterally and distally is identified. No other soft tissue abnormalities are noted. Degenerative changes are seen in the hindfoot between the talus and navicular bone. The ankle mortise is intact. No acute fractures are seen. IMPRESSION: No acute abnormality. Electronically Signed   By: Gerome Sam III M.D   On: 07/21/2017 18:06   Dg Knee Complete 4 Views Right  Result Date: 07/21/2017 CLINICAL DATA:  Pain after fall EXAM: RIGHT KNEE - COMPLETE 4+ VIEW COMPARISON:  None. FINDINGS: A rod is seen in the proximal tibia. No acute fractures are seen. No joint effusion. An unusual contour to the proximal fibula is felt to represent sequela of remote injury. IMPRESSION: No acute abnormalities identified. Electronically Signed   By: Gerome Sam III M.D   On: 07/21/2017 18:07   Dg Foot Complete Right  Result Date: 07/21/2017 CLINICAL DATA:  Pain and swelling after fall. EXAM: RIGHT FOOT COMPLETE - 3+ VIEW COMPARISON:  None. FINDINGS: There is no evidence of fracture or dislocation. There is no evidence of arthropathy or other focal bone abnormality. Soft tissues are unremarkable. IMPRESSION: Negative. Electronically Signed   By: Gerome Sam III M.D   On: 07/21/2017 18:05    Procedures Procedures (including critical care time)  Medications Ordered in ED Medications - No data to display   Initial Impression / Assessment and Plan / ED Course  I have reviewed the triage vital signs and the nursing notes.  Pertinent labs & imaging results that were available during my care of the patient were reviewed by me and considered in my medical decision making (see chart for details).  Final Clinical Impressions(s) / ED Diagnoses   {I have reviewed and evaluated the relevant imaging studies.  {I have reviewed the relevant previous healthcare records.  {I obtained HPI from historian.   ED Course:  Assessment: Patient  X-Ray negative for obvious fracture or dislocation. Possible LCL sprain. Pt advised to follow up with orthopedics. Patient given brace while in ED, conservative therapy recommended and discussed. Patient will be discharged home & is agreeable with above plan. Returns precautions discussed. Pt appears safe for discharge  Disposition/Plan:  DC Home Additional Verbal discharge instructions given and discussed with patient.  Pt Instructed to f/u with Ortho in the next week for evaluation and treatment of symptoms. Return precautions given Pt acknowledges and agrees with plan  Supervising Physician Vanetta Mulders, MD  Final diagnoses:  Acute pain of right knee    New Prescriptions New Prescriptions   No medications on file       Audry Pili, Cordelia Poche 07/21/17 1850    Vanetta Mulders, MD 07/21/17 2153

## 2017-07-21 NOTE — Discharge Instructions (Signed)
Please read and follow all provided instructions.  Your diagnoses today include:  1. Acute pain of right knee     Tests performed today include: Vital signs. See below for your results today.   Medications prescribed:  Take as prescribed   Home care instructions:  Follow any educational materials contained in this packet.  Follow-up instructions: Please follow-up with your Orthopedics for further evaluation of symptoms and treatment   Return instructions:  Please return to the Emergency Department if you do not get better, if you get worse, or new symptoms OR  - Fever (temperature greater than 101.39F)  - Bleeding that does not stop with holding pressure to the area    -Severe pain (please note that you may be more sore the day after your accident)  - Chest Pain  - Difficulty breathing  - Severe nausea or vomiting  - Inability to tolerate food and liquids  - Passing out  - Skin becoming red around your wounds  - Change in mental status (confusion or lethargy)  - New numbness or weakness    Please return if you have any other emergent concerns.  Additional Information:  Your vital signs today were: BP 125/77 (BP Location: Left Arm)    Pulse 98    Temp 98.4 F (36.9 C) (Oral)    Resp 18    LMP 07/07/2017 (Within Weeks)    SpO2 100%  If your blood pressure (BP) was elevated above 135/85 this visit, please have this repeated by your doctor within one month. ---------------

## 2018-10-15 IMAGING — CR DG KNEE COMPLETE 4+V*R*
4 series · 4 of 4 positions shown · non-contrast
Comparison: None.

CLINICAL DATA: Pain after fall

EXAM:
RIGHT KNEE - COMPLETE 4+ VIEW

[t knee ap right]
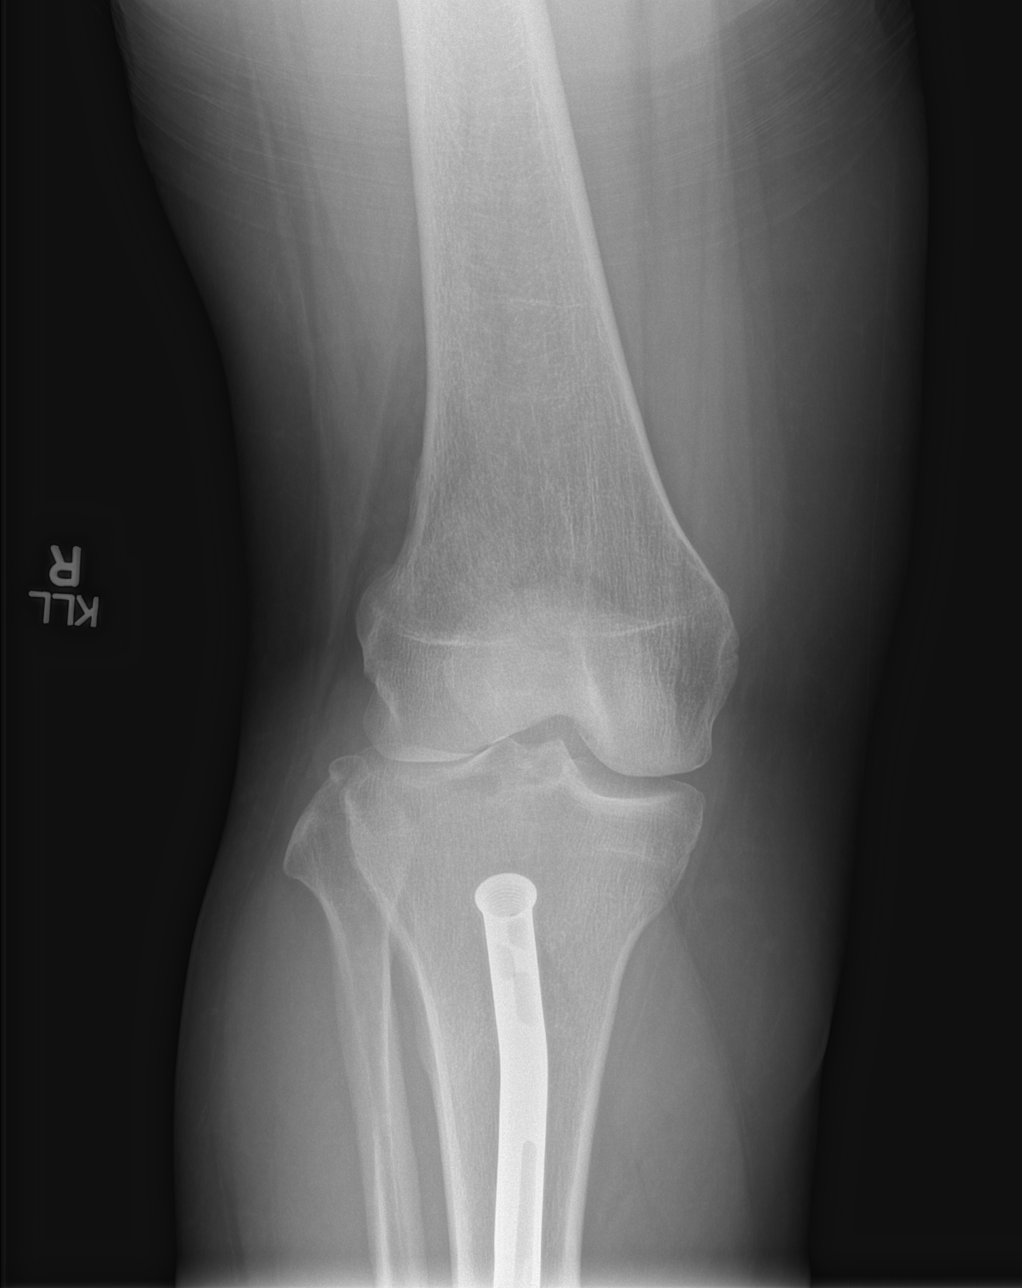

[t knee obl right (1 of 2)]
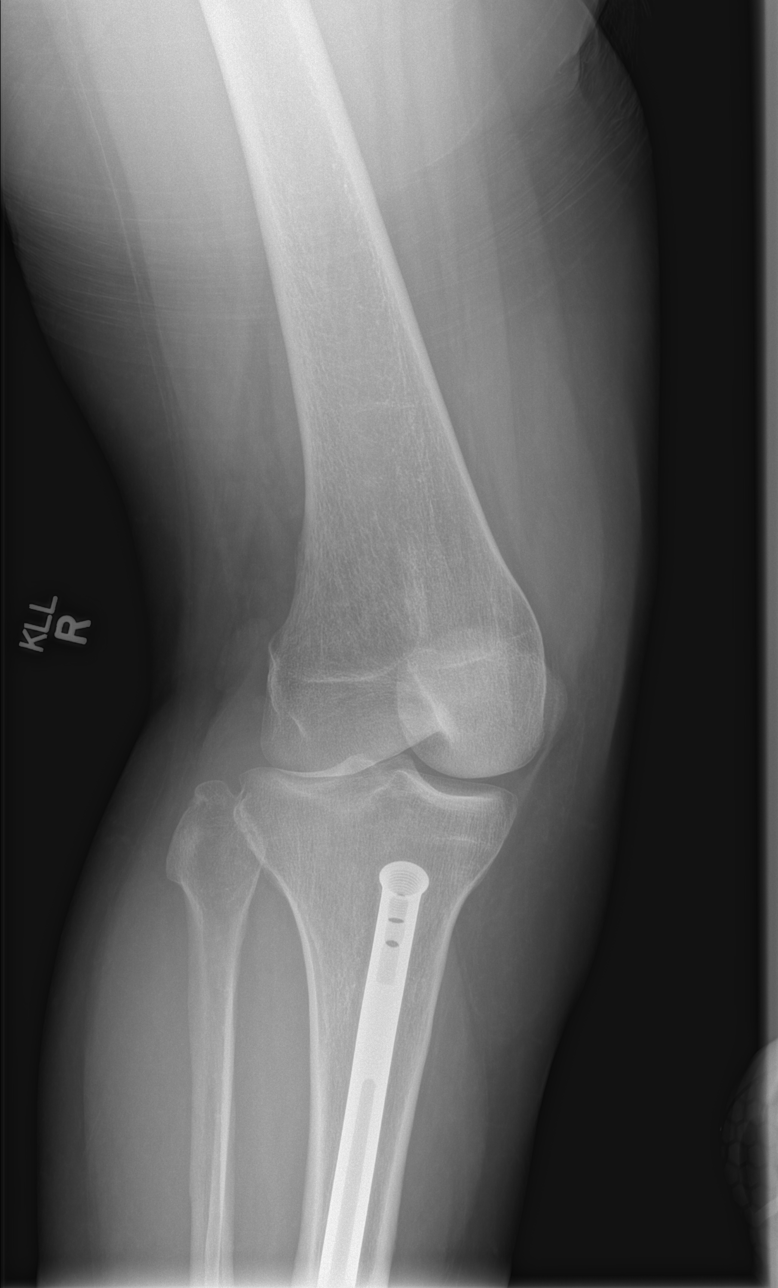

[t knee obl right (2 of 2)]
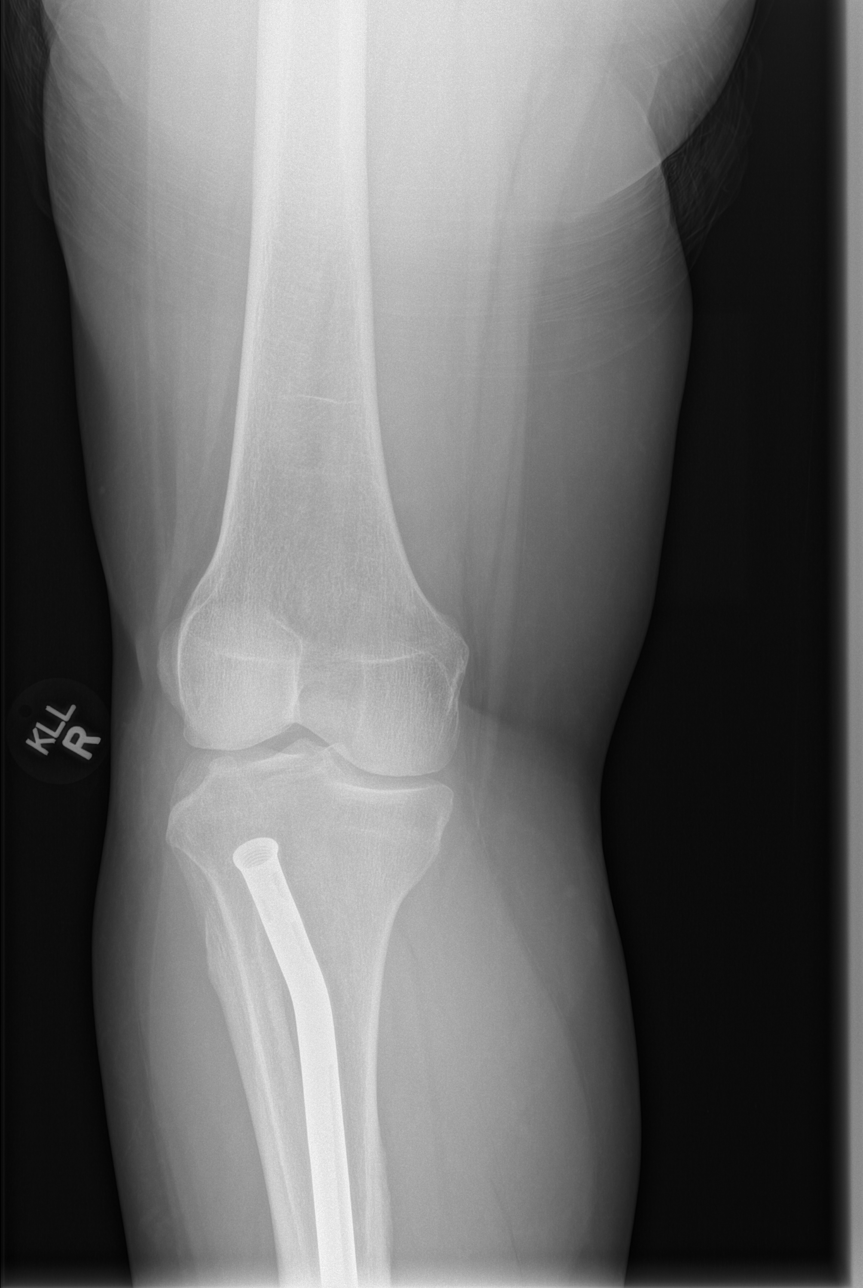

[t knee lat right]
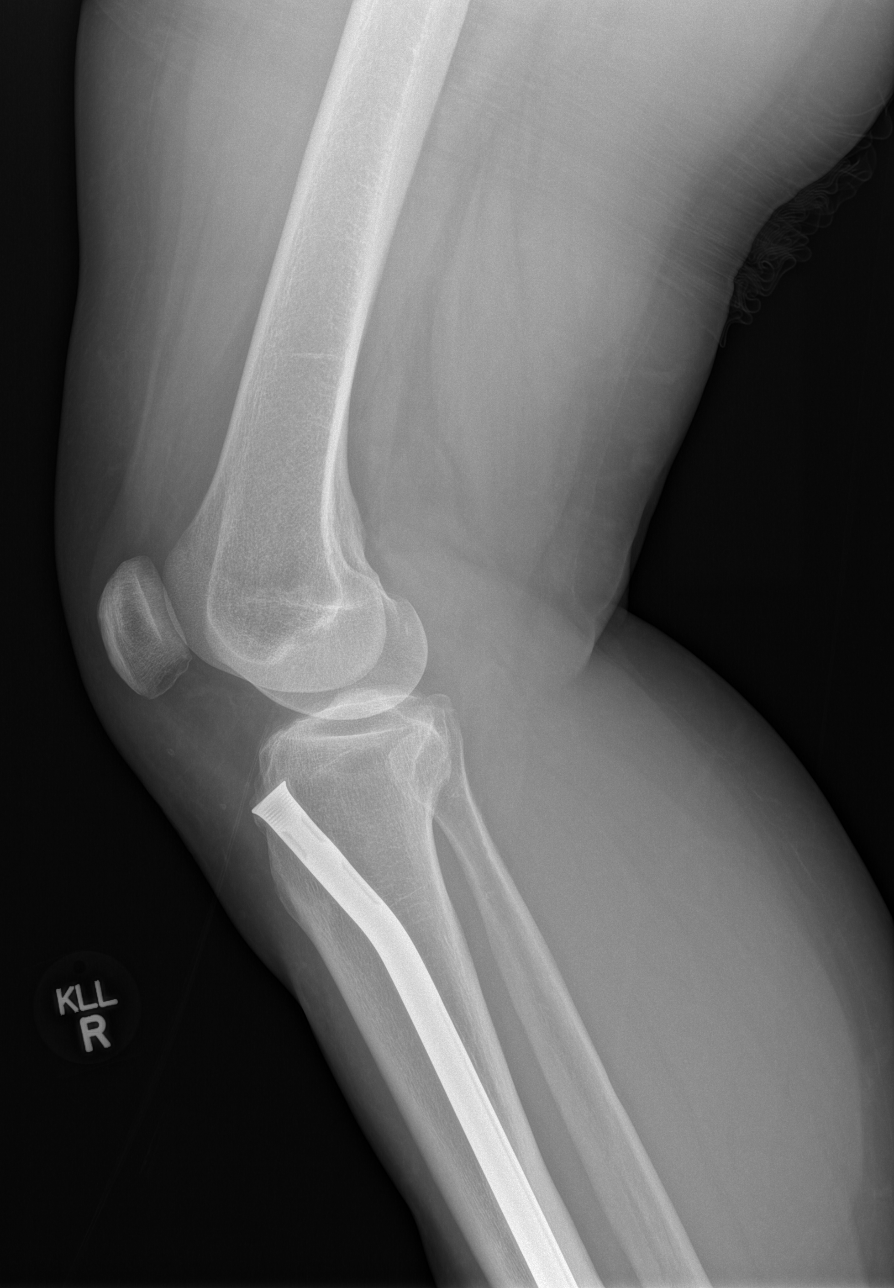

[4 of 4 positions shown; findings below may reference images not displayed]

FINDINGS: A rod is seen in the proximal tibia. No acute fractures are seen. No
joint effusion. An unusual contour to the proximal fibula is felt to
represent sequela of remote injury.
IMPRESSION: No acute abnormalities identified.

## 2018-10-15 IMAGING — CR DG ANKLE COMPLETE 3+V*R*
3 series · 3 of 3 positions shown · non-contrast
Comparison: None.

CLINICAL DATA: Pain after fall

EXAM:
RIGHT ANKLE - COMPLETE 3+ VIEW

[x ankle lat right]
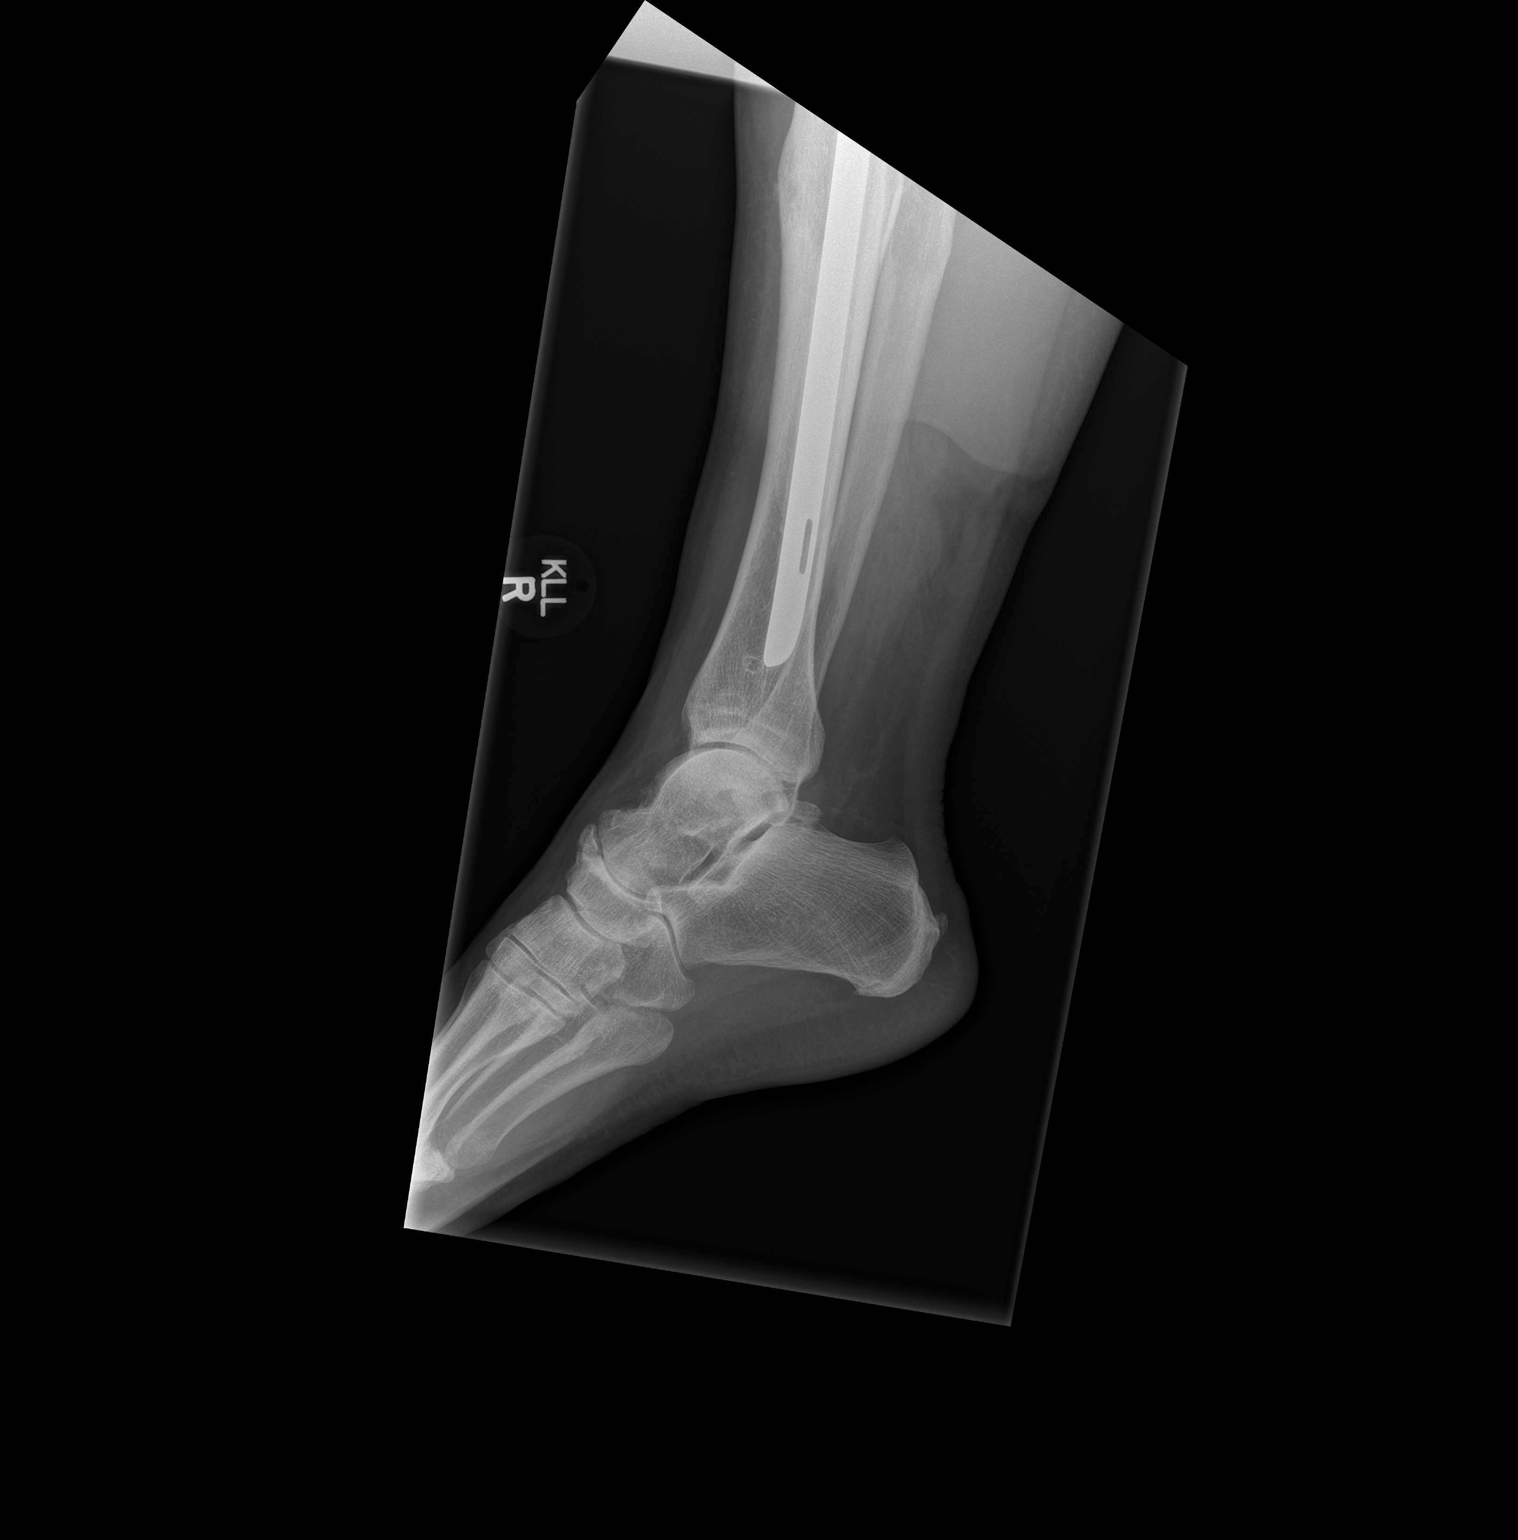

[x ankle ap right]
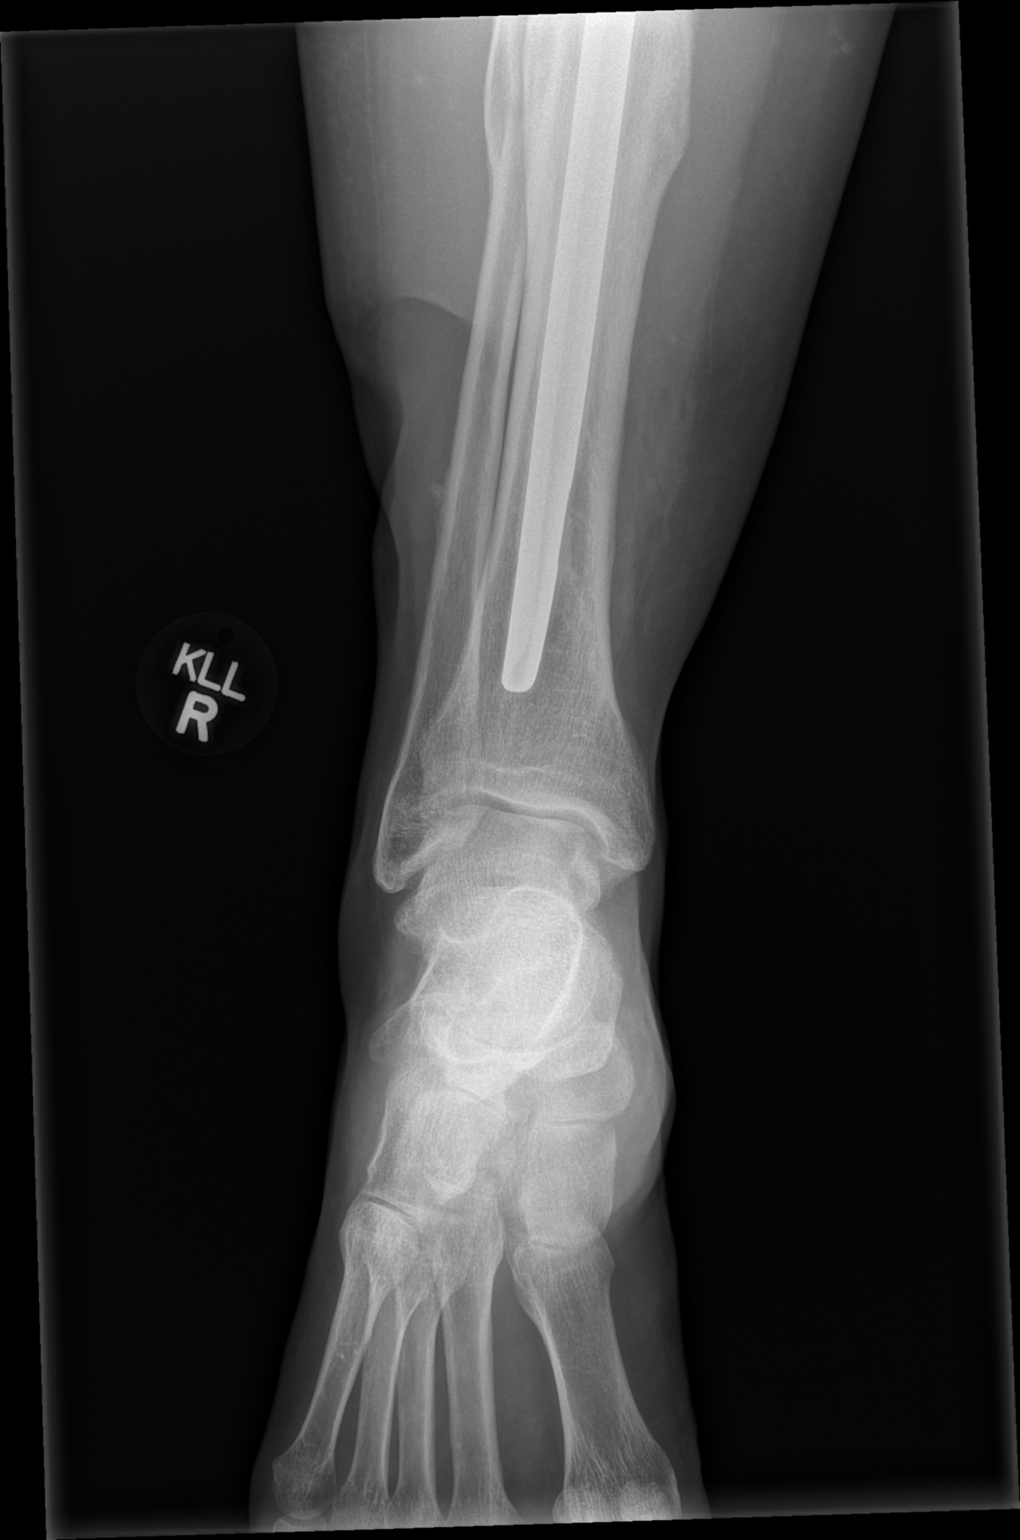

[x ankle obl right]
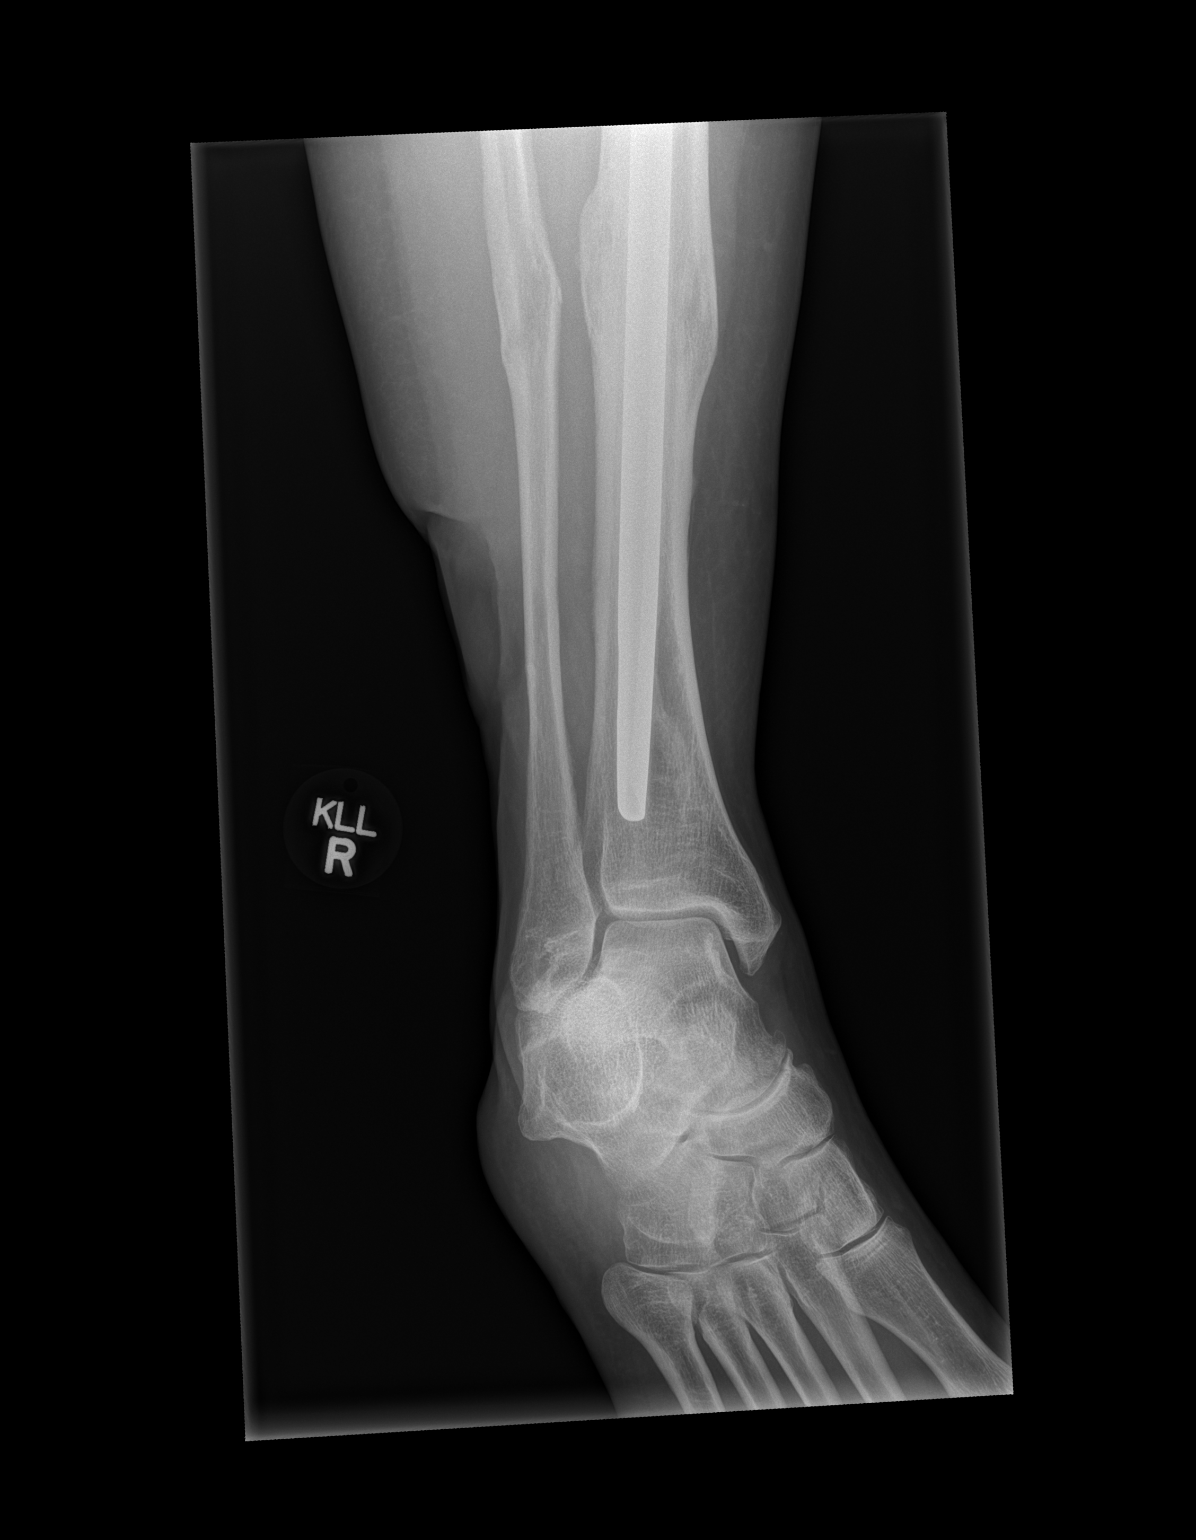

[3 of 3 positions shown; findings below may reference images not displayed]

FINDINGS: A rod is seen in the tibia. Expansion of the mid tibia and fibula,
only partially visualized, is consistent with healed remote
fracture. A contour abnormality in the soft tissues of the lower leg
laterally and distally is identified. No other soft tissue
abnormalities are noted. Degenerative changes are seen in the
hindfoot between the talus and navicular bone. The ankle mortise is
intact. No acute fractures are seen.
IMPRESSION: No acute abnormality.

## 2019-01-02 ENCOUNTER — Emergency Department
Admission: EM | Admit: 2019-01-02 | Discharge: 2019-01-02 | Disposition: A | Discharge status: Home / Self Care | Payer: Self-pay | Attending: Emergency Medicine | Admitting: Emergency Medicine

## 2019-01-02 ENCOUNTER — Emergency Department (HOSPITAL_COMMUNITY): Payer: Self-pay

## 2019-01-02 ENCOUNTER — Encounter (HOSPITAL_COMMUNITY): Payer: Self-pay

## 2019-01-02 ENCOUNTER — Other Ambulatory Visit: Payer: Self-pay

## 2019-01-02 DIAGNOSIS — R079 Chest pain, unspecified: Secondary | ICD-10-CM

## 2019-01-02 DIAGNOSIS — F17210 Nicotine dependence, cigarettes, uncomplicated: Secondary | ICD-10-CM | POA: Insufficient documentation

## 2019-01-02 DIAGNOSIS — R5383 Other fatigue: Secondary | ICD-10-CM | POA: Insufficient documentation

## 2019-01-02 DIAGNOSIS — N61 Mastitis without abscess: Secondary | ICD-10-CM | POA: Insufficient documentation

## 2019-01-02 LAB — CBC WITH DIFF
BASOPHIL #: 0.1 x10ˆ3/uL (ref 0.00–0.20)
BASOPHIL %: 1 %
EOSINOPHIL #: 0.3 10*3/uL (ref 0.00–0.50)
EOSINOPHIL %: 3 %
HCT: 41.9 % (ref 34.6–46.2)
HGB: 14.1 g/dL (ref 11.8–15.8)
LYMPHOCYTE #: 2.3 x10ˆ3/uL (ref 0.90–3.40)
LYMPHOCYTE %: 25 %
MCH: 30.3 pg (ref 27.6–33.2)
MCHC: 33.7 g/dL (ref 32.6–35.4)
MCV: 89.8 fL (ref 82.3–96.7)
MONOCYTE #: 0.6 x10ˆ3/uL (ref 0.20–0.90)
MONOCYTE %: 6 %
MPV: 8.2 fL (ref 6.6–10.2)
NEUTROPHIL #: 6 x10ˆ3/uL (ref 1.50–6.40)
NEUTROPHIL %: 64 %
PLATELETS: 289 x10ˆ3/uL (ref 140–440)
RBC: 4.66 10*6/uL (ref 3.80–5.24)
RDW: 14.3 % (ref 12.4–15.2)
WBC: 9.3 x10ˆ3/uL (ref 3.5–10.3)

## 2019-01-02 LAB — PT/INR: INR: 0.92 (ref 0.80–1.10)

## 2019-01-02 LAB — BASIC METABOLIC PANEL
ANION GAP: 4 mmol/L
BUN/CREA RATIO: 19
BUN: 15 mg/dL (ref 10–25)
CALCIUM: 9.1 mg/dL (ref 8.8–10.3)
CHLORIDE: 101 mmol/L (ref 98–111)
CO2 TOTAL: 29 mmol/L (ref 21–35)
CREATININE: 0.78 mg/dL (ref <=1.30)
ESTIMATED GFR: >60 mL/min/1.73mˆ2
GLUCOSE: 107 mg/dL (ref 70–110)
POTASSIUM: 4.4 mmol/L (ref 3.5–5.0)
SODIUM: 134 mmol/L — ABNORMAL LOW (ref 135–145)

## 2019-01-02 LAB — ECG 12 LEAD - ED USE
Calculated P Axis: 44 deg
Calculated T Axis: 40 deg
EKG Severity: NORMAL
Heart Rate: 63 {beats}/min
I 40 Axis: 46 deg
PR Interval: 164 ms
QRS Axis: 38 deg
QRS Duration: 86 ms
QT Interval: 429 ms
QTC Calculation: 440 ms
ST Axis: 37 deg
T 40 Axis: 41 deg

## 2019-01-02 LAB — TROPONIN-I: TROPONIN I: 0 ng/mL (ref <=0.04)

## 2019-01-02 MED ORDER — ONDANSETRON HCL (PF) 4 MG/2 ML INJECTION SOLUTION
4.00 mg | INTRAMUSCULAR | Status: AC
Start: 2019-01-02 — End: 2019-01-02
  Administered 2019-01-02: 4 mg via INTRAVENOUS
  Filled 2019-01-02: qty 2

## 2019-01-02 MED ORDER — AMOXICILLIN 875 MG-POTASSIUM CLAVULANATE 125 MG TABLET: 1 | Tab | Freq: Two times a day (BID) | ORAL | 0 refills | 0 days | Status: AC

## 2019-01-02 NOTE — ED Nurses Note (Signed)
Patient here for chest pain off and on for past month. States over last few days has noticed a lump in breast. Hx of lumpectomy for questionable mass but patient states she never followed up further. Patient states the mass is hard,painful, and hot. No hx of infections that she knows of. Pete Pelt, RN  01/02/2019, 10:55

## 2019-01-02 NOTE — ED Provider Notes (Signed)
Department of Emergency [facility]    HPI - 01/02/2019       Attending: Dr. Amada Jupiter  Chief Complaint:  Chief Complaint   Patient presents with   . Chest Pain      chest pain x 1 month, 5 days ago mass felt in breast, anxious in triage nausea , states irregular mass, and breast changes     History of Present Illness:  Michelle Carpenter is a 48 y.o. female with intermittent episodes of chest pain x 1 month. Associated nausea and generalized fatigue. Patient reports she had a lumpectomy in [place] 2 years ago, although didn't go to her follow-up. She states that just 6 days ago she noticed a "lump" in the left breast below the nipple. Described as uncomfortable and burning. 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 She denies shortness of breath, discharge, vomiting, fevers, and any other complaints or concerns at this time. Denies chance of pregnancy.    Of note, patient travels for work and does not follow with anyone in the state for medical care.          History Limitations: None    Review of Systems:  Constitutional: No fever, chills or weakness +Fatigue  HENT: No congestion or rhinorrhea   Eyes: No vision changes, no discharge   Cardio: No palpitations or leg swelling  +Chest pain  Respiratory: No cough, wheezing or SOB   GI:  No vomiting, diarrhea, constipation or abdominal pain +Nausea  GU:  No dysuria, hematuria, polyuria   MSK: No joint or back pain   Skin: No rashes or diaphoresis. No discharge. +Breast lump with burning  Neuro: No loss of sensation, headache, focal deficits or LOC     All other systems reviewed and are negative    Allergies:  Allergies   Allergen Reactions   . Ciprofloxacin      Past Medical History:  History reviewed. No pertinent past medical history.      Past Surgical History:  Past Surgical History:   Procedure Laterality Date   . CLOSED REDUCTION TIBIAL FRACTURE     . HX BREAST LUMPECTOMY           Social  History:  Social History     Tobacco Use   . Smoking status: Heavy Tobacco Smoker     Packs/day: 1.00   . Smokeless tobacco: Never Used   Substance Use Topics   . Alcohol use: Yes     Family History:  Family Medical History:     None            Physical Exam:  All nurse's notes reviewed.    ED Triage Vitals [01/02/19 1027]   BP (Non-Invasive) (!) 141/83   Heart Rate 67   Respiratory Rate 18   Temperature 36.7 C (98.1 F)   SpO2 99 %   Weight 113.4 kg (250 lb)   Height 1.778 m (5' 10")     Filed Vitals:    01/02/19 1135 01/02/19 1156 01/02/19 1221 01/02/19 1251   BP: 128/77 (!) 125/58 133/76 140/88   Pulse: 57 62 59 62   Resp: 20 (!) _0 Temp:       SpO2: 98%            Constitutional: WD/WN female in no acute distress.  Alert and Oriented x 3.    HEENT:  Head: NC/AT    Ears: NL external ears.   Eyes:Conjunctiva pink, sclera white, no discharge.   Nose: No drainage or bleeding.        Mouth/Throat: MM moist.    Neck:  Trachea midline.  No deformity.  Cardiovascular: Heart RRR, no gallops.    Pulmonary/Chest: Breath sounds equal bilaterally, good air movement. No respiratory distress. No wheezes, rales. 1.5 cm lump that is palpable 3 cm inferior to the left nipple. Slightly tender with overlying skin redness. No drainage.   Abdominal: Normal BS. Abdomen soft, ND, NT.      Musculoskeletal: No obvious deformity or swelling. 2+ peripheral pulses.  Skin: Warm and dry. No rash, erythema, pallor or cyanosis.    Neurological: A+O x 3. Grossly intact. Moves all extremities spontaneously. Face symmetrical, speech NL.      Orders, Abnormal Labs and Imaging Results:    Labs Reviewed   BASIC METABOLIC PANEL - Abnormal; Notable for the following components:       Result Value    SODIUM 134 (*)     All other components within normal limits    Narrative:     Estimated Glomerular Filtration Rate (eGFR) calculated using the CKD-EPI (2009) equation, intended for patients 8 years of age and older. If race and/or gender is  not documented or "unknown," there will be no eGFR calculation.   TROPONIN-I - Normal   PT/INR - Normal    Narrative:     Coumadin Therapy INR range for Conventional Anticoagulation Therapy is 2.0 to 3.0 and for Intensive Anticoagulation Therapy 2.5 to 3.5   CBC/DIFF    Narrative:     The following orders were created for panel order CBC/DIFF.  Procedure                               Abnormality         Status                     ---------                               -----------         ------                     CBC WITH WERX[[id number]]                                    Final result                 Please view results for these tests on the individual orders.   CBC WITH DIFF       XR CHEST PA AND LATERAL   Final Result   Chronic lung changes. No acute process.            Radiologist location ID: [id number]             EKG:  Normal sinus rhythm. Rate 63. Normal PR interval. Normal axis. No QT prolongation. No ST changes. No evidence of acute ischemia or injury. No arrhythmia.     MDM:    During the patient's stay in the emergency department, images and/or labs were performed to assist with medical decision making and were reviewed by myself.  Medications   ondansetron (ZOFRAN) 2 mg/mL injection (4 mg Intravenous Given 01/02/19 1150)          Patient is a slightly tender lump on her left breast chest below the nipple, small, with some erythema.  I suspect cellulitis versus early abscess more than breast cancer.  Patient still needs to follow up with family doctor and have mammogram done however I will treat her with antibiotics at this time.  Patient should have a recheck in 3 days.  She was given referral to [facility] and she may return to the ED if they are unable to see her for recheck in 3 days.    Patient was advised to follow up with her PMD for re-evaluation.  If not able to do so, patient was advised that they may return to the ED for a re-evaluation.  If patient develops any new symptoms,  problems, has any concerns, she may return to the ED at any time for an immediate re-evaluation.    Consults:  None    Impression:   Encounter Diagnosis   Name Primary?   . Cellulitis of left breast Yes         Disposition:  Discharged      Discharge: Following the above history, physical exam, and studies, the patient was deemed stable and suitable for discharge.  It was advised that the patient return to the ED if they develop new or any other concerning symptoms, and follow up as directed.   The patient and patient's family verbalized understanding of all instructions and had no further questions or concerns.    Critical Care: None     Follow Up:   [facility]  [address]  [address]  [place] [address]  [phone]    In 3 days  To establish a primary care doctor, For a follow-up from the ED    [facility] ED  [place] [address]  [phone]  Go to   Return to the ED for a recheck in 3 days if not able to see a family doctor      Prescriptions:      Current Discharge Medication List      START taking these medications    Details   amoxicillin-pot clavulanate (AUGMENTIN) 875-125 mg Oral Tablet Take 1 Tab by mouth Every 12 hours  Qty: 20 Tab, Refills: 0             No future appointments.    I am scribing for, and in the presence of, Dr. Amada Jupiter for services provided on 01/02/2019.  Jaquelyn Bitter, Scribe  [place], [place] 01/02/2019, 11:29    I personally performed the services described in this documentation, as scribed  in my presence, and it is both accurate  and complete.    Amada Jupiter, MD      This note was partially generated using MModal Fluency Direct system, and there may be some incorrect words, spellings, and punctuation that were not noted in checking the note before saving.

## 2019-08-30 ENCOUNTER — Telehealth: Payer: Self-pay | Admitting: Physician Assistant

## 2019-08-30 DIAGNOSIS — Z20822 Contact with and (suspected) exposure to covid-19: Secondary | ICD-10-CM

## 2019-08-30 NOTE — Progress Notes (Signed)
E-Visit for Corona Virus Screening   Your current symptoms could be consistent with the coronavirus.  Many health care providers can now test patients at their office but not all are.  [facility] has multiple testing sites. For information on our COVID testing locations and hours go to [email or web address]  Please quarantine yourself while awaiting your test results.  We are enrolling you in our MyChart Home Montioring for COVID19 . Daily you will receive a questionnaire within the MyChart website. Our COVID 19 response team willl be monitoriing your responses daily.    COVID-19 is a respiratory illness with symptoms that are similar to the flu. Symptoms are typically mild to moderate, but there have been cases of severe illness and death due to the virus. The following symptoms may appear 2-14 days after exposure: . Fever . Cough . Shortness of breath or difficulty breathing . Chills . Repeated shaking with chills . Muscle pain . Headache . Sore throat . New loss of taste or smell . Fatigue . Congestion or runny nose . Nausea or vomiting . Diarrhea  It is vitally important that if you feel that you have an infection such as this virus or any other virus that you stay home and away from places where you may spread it to others.  You should self-quarantine for 14 days if you have symptoms that could potentially be coronavirus or have been in close contact a with a person diagnosed with COVID-19 within the last 2 weeks. You should avoid contact with people age 65 and older.   You should wear a mask or cloth face covering over your nose and mouth if you must be around other people or animals, including pets (even at home). Try to stay at least 6 feet away from other people. This will protect the people around you.  You may also take acetaminophen (Tylenol) as needed for fever.   Reduce your risk of any infection by using the same precautions used for avoiding the  common cold or flu:  . Wash your hands often with soap and warm water for at least 20 seconds.  If soap and water are not readily available, use an alcohol-based hand sanitizer with at least 60% alcohol.  . If coughing or sneezing, cover your mouth and nose by coughing or sneezing into the elbow areas of your shirt or coat, into a tissue or into your sleeve (not your hands). . Avoid shaking hands with others and consider head nods or verbal greetings only. . Avoid touching your eyes, nose, or mouth with unwashed hands.  . Avoid close contact with people who are sick. . Avoid places or events with large numbers of people in one location, like concerts or sporting events. . Carefully consider travel plans you have or are making. . If you are planning any travel outside or inside the [place], visit the CDC's Travelers' Health webpage for the latest health notices. . If you have some symptoms but not all symptoms, continue to monitor at home and seek medical attention if your symptoms worsen. . If you are having a medical emergency, call 911.  HOME CARE . Only take medications as instructed by your medical team. . Drink plenty of fluids and get plenty of rest. . A steam or ultrasonic humidifier can help if you have congestion.   GET HELP RIGHT AWAY IF YOU HAVE EMERGENCY WARNING SIGNS** FOR COVID-19. If you or someone is showing any of these signs seek emergency medical care immediately. Call   911 or proceed to your closest emergency facility if: . You develop worsening high fever. . Trouble breathing . Bluish lips or face . Persistent pain or pressure in the chest . New confusion . Inability to wake or stay awake . You cough up blood. . Your symptoms become more severe  **This list is not all possible symptoms. Contact your medical provider for any symptoms that are sever or concerning to you.   MAKE SURE YOU   Understand these instructions.  Will watch your condition.  Will get help right  away if you are not doing well or get worse.  Your e-visit answers were reviewed by a board certified advanced clinical practitioner to complete your personal care plan.  Depending on the condition, your plan could have included both over the counter or prescription medications.  If there is a problem please reply once you have received a response from your provider.  Your safety is important to us.  If you have drug allergies check your prescription carefully.    You can use MyChart to ask questions about today's visit, request a non-urgent call back, or ask for a work or school excuse for 24 hours related to this e-Visit. If it has been greater than 24 hours you will need to follow up with your provider, or enter a new e-Visit to address those concerns. You will get an e-mail in the next two days asking about your experience.  I hope that your e-visit has been valuable and will speed your recovery. Thank you for using e-visits.    

## 2019-08-30 NOTE — Progress Notes (Signed)
I have spent 5 minutes in review of e-visit questionnaire, review and updating patient chart, medical decision making and response to patient.   Mega Kinkade Cody Kaveh Kissinger, PA-C    

## 2019-08-31 ENCOUNTER — Emergency Department (HOSPITAL_COMMUNITY): Payer: Medicaid Other

## 2019-08-31 ENCOUNTER — Encounter (HOSPITAL_COMMUNITY): Payer: Self-pay | Admitting: Emergency Medicine

## 2019-08-31 ENCOUNTER — Other Ambulatory Visit: Payer: Self-pay

## 2019-08-31 ENCOUNTER — Emergency Department (HOSPITAL_COMMUNITY)
Admission: EM | Admit: 2019-08-31 | Discharge: 2019-08-31 | Disposition: A | Discharge status: Home / Self Care | Payer: Medicaid Other | Attending: Emergency Medicine | Admitting: Emergency Medicine

## 2019-08-31 DIAGNOSIS — Z20828 Contact with and (suspected) exposure to other viral communicable diseases: Secondary | ICD-10-CM | POA: Insufficient documentation

## 2019-08-31 DIAGNOSIS — R05 Cough: Secondary | ICD-10-CM | POA: Insufficient documentation

## 2019-08-31 DIAGNOSIS — M791 Myalgia, unspecified site: Secondary | ICD-10-CM | POA: Insufficient documentation

## 2019-08-31 DIAGNOSIS — R519 Headache, unspecified: Secondary | ICD-10-CM | POA: Insufficient documentation

## 2019-08-31 DIAGNOSIS — R509 Fever, unspecified: Secondary | ICD-10-CM | POA: Insufficient documentation

## 2019-08-31 DIAGNOSIS — Z20822 Contact with and (suspected) exposure to covid-19: Secondary | ICD-10-CM

## 2019-08-31 DIAGNOSIS — F17210 Nicotine dependence, cigarettes, uncomplicated: Secondary | ICD-10-CM | POA: Insufficient documentation

## 2019-08-31 LAB — COMPREHENSIVE METABOLIC PANEL
ALT: 37 U/L (ref 0–44)
AST: 21 U/L (ref 15–41)
Albumin: 3.9 g/dL (ref 3.5–5.0)
Alkaline Phosphatase: 68 U/L (ref 38–126)
Anion gap: 9 (ref 5–15)
BUN: 15 mg/dL (ref 6–20)
CO2: 23 mmol/L (ref 22–32)
Calcium: 9 mg/dL (ref 8.9–10.3)
Chloride: 105 mmol/L (ref 98–111)
Creatinine, Ser: 1.01 mg/dL — ABNORMAL HIGH (ref 0.44–1.00)
GFR calc Af Amer: >60 mL/min (ref >60)
GFR calc non Af Amer: >60 mL/min (ref >60)
Glucose, Bld: 97 mg/dL (ref 70–99)
Potassium: 4.2 mmol/L (ref 3.5–5.1)
Sodium: 137 mmol/L (ref 135–145)
Total Bilirubin: 0.5 mg/dL (ref 0.3–1.2)
Total Protein: 7.1 g/dL (ref 6.5–8.1)

## 2019-08-31 LAB — CBC WITH DIFFERENTIAL/PLATELET
Abs Immature Granulocytes: 0.02 10*3/uL (ref 0.00–0.07)
Basophils Absolute: 0.1 10*3/uL (ref 0.0–0.1)
Basophils Relative: 1 %
Eosinophils Absolute: 0.2 10*3/uL (ref 0.0–0.5)
Eosinophils Relative: 3 %
HCT: 45.8 % (ref 36.0–46.0)
Hemoglobin: 14.6 g/dL (ref 12.0–15.0)
Immature Granulocytes: 0 %
Lymphocytes Relative: 24 %
Lymphs Abs: 2.2 10*3/uL (ref 0.7–4.0)
MCH: 29.3 pg (ref 26.0–34.0)
MCHC: 31.9 g/dL (ref 30.0–36.0)
MCV: 92 fL (ref 80.0–100.0)
Monocytes Absolute: 0.6 10*3/uL (ref 0.1–1.0)
Monocytes Relative: 6 %
Neutro Abs: 6.2 10*3/uL (ref 1.7–7.7)
Neutrophils Relative %: 66 %
Platelets: 325 10*3/uL (ref 150–400)
RBC: 4.98 MIL/uL (ref 3.87–5.11)
RDW: 13 % (ref 11.5–15.5)
WBC: 9.3 10*3/uL (ref 4.0–10.5)
nRBC: 0 % (ref 0.0–0.2)

## 2019-08-31 MED ORDER — ZINC GLUCONATE 50 MG PO TABS: 50.0000 mg | ORAL_TABLET | Freq: Every day | ORAL | 0 refills | Status: AC

## 2019-08-31 MED ORDER — PREDNISONE 50 MG PO TABS
60.0000 mg | ORAL_TABLET | Freq: Once | ORAL | Status: AC
  Administered 2019-08-31: 19:00:00 60 mg via ORAL
  Filled 2019-08-31: qty 1

## 2019-08-31 MED ORDER — PREDNISONE 10 MG PO TABS: 40.0000 mg | ORAL_TABLET | Freq: Every day | ORAL | 0 refills | Status: AC

## 2019-08-31 MED ORDER — ALBUTEROL SULFATE HFA 108 (90 BASE) MCG/ACT IN AERS
2.0000 | INHALATION_SPRAY | Freq: Four times a day (QID) | RESPIRATORY_TRACT | Status: DC
  Administered 2019-08-31: 2 via RESPIRATORY_TRACT
  Filled 2019-08-31: qty 6.7

## 2019-08-31 NOTE — ED Triage Notes (Signed)
PT c/o congested productive cough, sore throat, low grade fevers treated with tylenol and body aches x9 days.

## 2019-08-31 NOTE — ED Provider Notes (Signed)
[facility] EMERGENCY DEPARTMENT Provider Note   CSN: [id number] Arrival date & time: 08/31/19  1405     History   Chief Complaint Chief Complaint  Patient presents with  . Cough    HPI Danielle Morales is a 48 y.o. female.     Patient with 9-day history of productive cough sore throat low-grade fevers body aches headache no nausea vomiting or diarrhea.  Patient subjectively feels short of breath.  Oxygen saturations upon arrival here were 99% on room air.  Patient without any known Covid exposure.  Patient has not been tested recently.  Patient's been taking over-the-counter NyQuil flulike medications.     History reviewed. No pertinent past medical history.  There are no active problems to display for this patient.   Past Surgical History:  Procedure Laterality Date  . BREAST LUMPECTOMY    . LEG SURGERY    . TUBAL LIGATION       OB History    Gravida      Para      Term      Preterm      AB      Living  6     SAB      TAB      Ectopic      Multiple      Live Births               Home Medications    Prior to Admission medications   Medication Sig Start Date End Date Taking? Authorizing Provider  ibuprofen (ADVIL,MOTRIN) 600 MG tablet Take 1 tablet (600 mg total) by mouth every 6 (six) hours as needed. 07/21/17   Shary Decamp, PA-C  metoCLOPramide (REGLAN) 10 MG tablet Take 1 tablet (10 mg total) by mouth every 8 (eight) hours as needed for nausea or vomiting. 04/05/17   Clayton Bibles, PA-C  pantoprazole (PROTONIX) 20 MG tablet Take 1 tablet (20 mg total) by mouth daily. 04/05/17   Clayton Bibles, PA-C  predniSONE (DELTASONE) 10 MG tablet Take 4 tablets (40 mg total) by mouth daily. 08/31/19   Fredia Sorrow, MD  sucralfate (CARAFATE) 1 g tablet Take 1 tablet (1 g total) by mouth 4 (four) times daily -  with meals and at bedtime. 04/05/17   Clayton Bibles, PA-C  sulfamethoxazole-trimethoprim (BACTRIM DS,SEPTRA DS) 800-160 MG tablet Take 1 tablet by  mouth 2 (two) times daily. X 3 days 04/05/17   Clayton Bibles, PA-C  zinc gluconate 50 MG tablet Take 1 tablet (50 mg total) by mouth daily. 08/31/19   Fredia Sorrow, MD    Family History History reviewed. No pertinent family history.  Social History Social History   Tobacco Use  . Smoking status: Current Every Day Smoker    Packs/day: 0.50    Types: Cigarettes  . Smokeless tobacco: Never Used  Substance Use Topics  . Alcohol use: No  . Drug use: No     Allergies   Ciprofloxacin   Review of Systems Review of Systems  Constitutional: Positive for fever. Negative for chills.  HENT: Positive for congestion. Negative for rhinorrhea and sore throat.   Eyes: Negative for visual disturbance.  Respiratory: Positive for cough and shortness of breath.   Cardiovascular: Negative for chest pain and leg swelling.  Gastrointestinal: Negative for abdominal pain, diarrhea, nausea and vomiting.  Genitourinary: Negative for dysuria.  Musculoskeletal: Positive for myalgias. Negative for back pain and neck pain.  Skin: Negative for rash.  Neurological: Positive for headaches. Negative for  dizziness and light-headedness.  Hematological: Does not bruise/bleed easily.  Psychiatric/Behavioral: Negative for confusion.     Physical Exam Updated Vital Signs BP 106/74 (BP Location: Right Arm)   Pulse 87   Temp 98.4 F (36.9 C) (Oral)   Resp 20   Ht 1.778 m (5\' 10" )   Wt 127 kg   LMP 06/30/2019   SpO2 99%   BMI 40.18 kg/m   Physical Exam Vitals signs and nursing note reviewed.  Constitutional:      General: She is not in acute distress.    Appearance: Normal appearance. She is well-developed.  HENT:     Head: Normocephalic and atraumatic.  Eyes:     Extraocular Movements: Extraocular movements intact.     Conjunctiva/sclera: Conjunctivae normal.     Pupils: Pupils are equal, round, and reactive to light.  Neck:     Musculoskeletal: Normal range of motion and neck supple.   Cardiovascular:     Rate and Rhythm: Normal rate and regular rhythm.     Heart sounds: No murmur.  Pulmonary:     Effort: Pulmonary effort is normal. No respiratory distress.     Breath sounds: Normal breath sounds.  Abdominal:     Palpations: Abdomen is soft.     Tenderness: There is no abdominal tenderness.  Musculoskeletal: Normal range of motion.  Skin:    General: Skin is warm and dry.  Neurological:     General: No focal deficit present.     Mental Status: She is alert and oriented to person, place, and time.      ED Treatments / Results  Labs (all labs ordered are listed, but only abnormal results are displayed) Labs Reviewed  COMPREHENSIVE METABOLIC PANEL - Abnormal; Notable for the following components:      Result Value   Creatinine, Ser 1.01 (*)    All other components within normal limits  NOVEL CORONAVIRUS, NAA (HOSP ORDER, SEND-OUT TO REF LAB; TAT 18-24 HRS)  CBC WITH DIFFERENTIAL/PLATELET    EKG None  Radiology Dg Chest Port 1 View  Result Date: 08/31/2019 CLINICAL DATA:  COVID-19 like symptoms, productive cough, fever EXAM: PORTABLE CHEST 1 VIEW COMPARISON:  04/05/2017 FINDINGS: The heart size and mediastinal contours are within normal limits. Both lungs are clear. The visualized skeletal structures are unremarkable. IMPRESSION: No acute abnormality of the lungs in AP portable projection. Electronically Signed   By: 04/07/2017 M.D.   On: 08/31/2019 16:42    Procedures Procedures (including critical care time)  Medications Ordered in ED Medications  albuterol (VENTOLIN HFA) 108 (90 Base) MCG/ACT inhaler 2 puff (has no administration in time range)  predniSONE (DELTASONE) tablet 60 mg (has no administration in time range)     Initial Impression / Assessment and Plan / ED Course  I have reviewed the triage vital signs and the nursing notes.  Pertinent labs & imaging results that were available during my care of the patient were reviewed by me and  considered in my medical decision making (see chart for details).       Symptoms highly suggestive of a COVID-19 infection.  Chest x-ray basic labs.  Evidence of hypoxia.  Chest x-ray without any acute evidence of pneumonia.  Outpatient testing for Covid has been done.  Should have the results in 1 to 2 days.  I will go ahead and treat her with an albuterol inhaler short course of prednisone as well as oral zinc supplement.  And recommending also vitamin D over-the-counter.  Patient nontoxic no acute distress.    Final Clinical Impressions(s) / ED Diagnoses   Final diagnoses:  Suspected COVID-19 virus infection    ED Discharge Orders         Ordered    predniSONE (DELTASONE) 10 MG tablet  Daily     08/31/19 1806    zinc gluconate 50 MG tablet  Daily     08/31/19 1806           Vanetta MuldersZackowski, Tonjia Parillo, MD 08/31/19 1808

## 2019-08-31 NOTE — Discharge Instructions (Addendum)
Outpatient Covid test has been performed.  You should get results in 24 to 48 hours.  Use the albuterol inhaler 2 puffs every 6 hours at least for the next 7 days this will help suppress the cough.  Also take the prednisone as directed starting tomorrow for the next 5 days.  Take the zinc which can have antiviral properties as directed.  Also recommend taking vitamin D something approximately 4000 units that can be bought over-the-counter or at [facility].  Chest x-ray was negative for any signs of pneumonia.  Return for any new or worse symptoms.  You can continue to take your over-the-counter cold and flu medicines with this regimen.

## 2019-09-02 ENCOUNTER — Other Ambulatory Visit: Payer: Self-pay

## 2019-09-02 ENCOUNTER — Emergency Department (HOSPITAL_COMMUNITY): Payer: Medicaid Other

## 2019-09-02 ENCOUNTER — Encounter (HOSPITAL_COMMUNITY): Payer: Self-pay

## 2019-09-02 ENCOUNTER — Telehealth: Payer: Self-pay

## 2019-09-02 ENCOUNTER — Encounter (INDEPENDENT_AMBULATORY_CARE_PROVIDER_SITE_OTHER): Payer: Self-pay

## 2019-09-02 ENCOUNTER — Emergency Department (HOSPITAL_COMMUNITY)
Admission: EM | Admit: 2019-09-02 | Discharge: 2019-09-02 | Disposition: A | Discharge status: Home / Self Care | Payer: Medicaid Other | Attending: Emergency Medicine | Admitting: Emergency Medicine

## 2019-09-02 DIAGNOSIS — R0602 Shortness of breath: Secondary | ICD-10-CM | POA: Insufficient documentation

## 2019-09-02 DIAGNOSIS — F17210 Nicotine dependence, cigarettes, uncomplicated: Secondary | ICD-10-CM | POA: Insufficient documentation

## 2019-09-02 DIAGNOSIS — Z79899 Other long term (current) drug therapy: Secondary | ICD-10-CM | POA: Insufficient documentation

## 2019-09-02 LAB — NOVEL CORONAVIRUS, NAA (HOSP ORDER, SEND-OUT TO REF LAB; TAT 18-24 HRS): SARS-CoV-2, NAA: NOT DETECTED

## 2019-09-02 LAB — INFLUENZA PANEL BY PCR (TYPE A & B)
Influenza A By PCR: NEGATIVE
Influenza B By PCR: NEGATIVE

## 2019-09-02 MED ORDER — BENZONATATE 100 MG PO CAPS: 100.0000 mg | ORAL_CAPSULE | Freq: Three times a day (TID) | ORAL | 0 refills | Status: AC

## 2019-09-02 MED ORDER — IPRATROPIUM-ALBUTEROL 0.5-2.5 (3) MG/3ML IN SOLN
3.0000 mL | Freq: Once | RESPIRATORY_TRACT | Status: AC
  Administered 2019-09-02: 3 mL via RESPIRATORY_TRACT
  Filled 2019-09-02: qty 3

## 2019-09-02 MED ORDER — FLUTICASONE PROPIONATE 50 MCG/ACT NA SUSP: 1.0000 | Freq: Every day | NASAL | 0 refills | Status: AC

## 2019-09-02 NOTE — ED Triage Notes (Signed)
Pt presents to ED with complaints of increased shortness of breath and cough since the last couple of days. Pt states her Covid test came back negative today but states she is getting worse instead of better. Pt currently on Prednisone and albuterol.

## 2019-09-02 NOTE — ED Notes (Signed)
Call to resp unanswered   Overhead paged

## 2019-09-02 NOTE — ED Notes (Signed)
Pt here 10/14- for same  Neg covid test Ambulated to room speaking in complete sentences without apparent increased work of breathing  Reports smoker since age 48 now smokes 1/2 PPd - coughing up white expectorant  VS WNL  Called by "the nurse" who suggested she go to Urgent Care or ER and pt chose here as she is uninsured and has been here for 1 month and has not "connected" with a free clinic per her report  Here with a pediatric pt who lives in same household who is also checking in

## 2019-09-02 NOTE — ED Provider Notes (Signed)
[facility] EMERGENCY DEPARTMENT Provider Note   CSN: [id number] Arrival date & time: 09/02/19  1624     History   Chief Complaint Chief Complaint  Patient presents with   Shortness of Breath    HPI Danielle Morales is a 48 y.o. female with a hx of tobacco abuse & prior tubal ligation who returns to the ED for continued dyspnea & coughing since last visit 08/31/19. Patient states she has felt poorly for about 10 days w/ sxs including nasal congestion, sore throat (now resolved), cough productive of white phlegm sputum, dyspnea (more so w/ exertion), chest pain only with coughing otherwise none, body aches, fevers w/ temp max 100.9, & chills. Seen in the ED 08/31/19- had CXR which was negative for abnormalities, basic labs that were overall reassuring, & covid testing which was negative. Discharged home with albuterol inhaler, prendisone, & zinc gluconate- taking meds as prescribed w/ exception of zinc gluconate as they did not have this at the pharmacy. No improvement. Cough/dyspena seem to be somewhat worse, she spoke with nursing line who recommended ED vs. UC re-evaluation. Patient states multiple family members are sick w/ similar sxs. Denies nausea, vomiting, melena, hematochezia, abdominal pain, leg pain/swelling, hemoptysis, recent surgery/trauma, recent long travel, hormone use, personal hx of cancer, or hx of DVT/PE.     HPI  No past medical history on file.  There are no active problems to display for this patient.   Past Surgical History:  Procedure Laterality Date   BREAST LUMPECTOMY     LEG SURGERY     TUBAL LIGATION       OB History    Gravida      Para      Term      Preterm      AB      Living  6     SAB      TAB      Ectopic      Multiple      Live Births               Home Medications    Prior to Admission medications   Medication Sig Start Date End Date Taking? Authorizing Provider  ibuprofen (ADVIL,MOTRIN) 600 MG tablet  Take 1 tablet (600 mg total) by mouth every 6 (six) hours as needed. 07/21/17   Audry PiliMohr, Tyler, PA-C  metoCLOPramide (REGLAN) 10 MG tablet Take 1 tablet (10 mg total) by mouth every 8 (eight) hours as needed for nausea or vomiting. 04/05/17   Trixie DredgeWest, Emily, PA-C  pantoprazole (PROTONIX) 20 MG tablet Take 1 tablet (20 mg total) by mouth daily. 04/05/17   Trixie DredgeWest, Emily, PA-C  predniSONE (DELTASONE) 10 MG tablet Take 4 tablets (40 mg total) by mouth daily. 08/31/19   Vanetta MuldersZackowski, Scott, MD  sucralfate (CARAFATE) 1 g tablet Take 1 tablet (1 g total) by mouth 4 (four) times daily -  with meals and at bedtime. 04/05/17   Trixie DredgeWest, Emily, PA-C  sulfamethoxazole-trimethoprim (BACTRIM DS,SEPTRA DS) 800-160 MG tablet Take 1 tablet by mouth 2 (two) times daily. X 3 days 04/05/17   Trixie DredgeWest, Emily, PA-C  zinc gluconate 50 MG tablet Take 1 tablet (50 mg total) by mouth daily. 08/31/19   Vanetta MuldersZackowski, Scott, MD    Family History No family history on file.  Social History Social History   Tobacco Use   Smoking status: Current Every Day Smoker    Packs/day: 0.50    Types: Cigarettes   Smokeless tobacco: Never Used  Substance Use Topics   Alcohol use: No   Drug use: No     Allergies   Ciprofloxacin   Review of Systems Review of Systems  Constitutional: Positive for chills and fever.  HENT: Positive for congestion and sore throat (resolved @ present). Negative for ear pain.   Respiratory: Positive for cough and shortness of breath.   Cardiovascular: Positive for chest pain (w/ coughing otherwise none). Negative for leg swelling.  Gastrointestinal: Negative for abdominal pain, blood in stool and vomiting.  Genitourinary: Negative for dysuria.  Musculoskeletal: Positive for myalgias.  Neurological: Negative for syncope, weakness and numbness.  All other systems reviewed and are negative.    Physical Exam Updated Vital Signs BP 119/68 (BP Location: Right Arm)    Pulse 92    Temp 98.3 F (36.8 C) (Oral)    Resp  (!) 22    Ht 5\' 10"  (1.778 m)    Wt 127 kg    SpO2 100%    BMI 40.18 kg/m   Physical Exam Vitals signs and nursing note reviewed.  Constitutional:      General: She is not in acute distress.    Appearance: She is well-developed.  HENT:     Head: Normocephalic and atraumatic.     Right Ear: Ear canal normal. Tympanic membrane is not perforated, erythematous, retracted or bulging.     Left Ear: Ear canal normal. Tympanic membrane is not perforated, erythematous, retracted or bulging.     Ears:     Comments: No mastoid erythema/swelling/tenderness.     Nose: Congestion present.     Right Sinus: No maxillary sinus tenderness or frontal sinus tenderness.     Left Sinus: No maxillary sinus tenderness or frontal sinus tenderness.     Mouth/Throat:     Pharynx: Uvula midline. No oropharyngeal exudate or posterior oropharyngeal erythema.     Comments: Posterior oropharynx is symmetric appearing. Patient tolerating own secretions without difficulty. No trismus. No drooling. No hot potato voice. No swelling beneath the tongue, submandibular compartment is soft.  Eyes:     General:        Right eye: No discharge.        Left eye: No discharge.     Conjunctiva/sclera: Conjunctivae normal.     Pupils: Pupils are equal, round, and reactive to light.  Neck:     Musculoskeletal: Normal range of motion and neck supple. No edema or neck rigidity.  Cardiovascular:     Rate and Rhythm: Normal rate and regular rhythm.     Heart sounds: No murmur.  Pulmonary:     Effort: Pulmonary effort is normal. No respiratory distress.     Breath sounds: Normal breath sounds. No wheezing, rhonchi or rales.     Comments: SpO2 100% on RA @ rest, ambulatory SpO2 >98% Chest:     Chest wall: Tenderness (mild anterior chest wall without overlying skin changes or palpable crepitus) present.  Abdominal:     General: There is no distension.     Palpations: Abdomen is soft.     Tenderness: There is no abdominal  tenderness.  Musculoskeletal:     Right lower leg: She exhibits no tenderness. No edema.     Left lower leg: She exhibits no tenderness. No edema.  Lymphadenopathy:     Cervical: No cervical adenopathy.  Skin:    General: Skin is warm and dry.     Findings: No rash.  Neurological:     Mental Status: She is alert.  Psychiatric:        Behavior: Behavior normal.    ED Treatments / Results  Labs (all labs ordered are listed, but only abnormal results are displayed) Labs Reviewed  INFLUENZA PANEL BY PCR (TYPE A & B)    EKG None  Radiology Dg Chest Port 1 View  Result Date: 08/31/2019 CLINICAL DATA:  COVID-19 like symptoms, productive cough, fever EXAM: PORTABLE CHEST 1 VIEW COMPARISON:  04/05/2017 FINDINGS: The heart size and mediastinal contours are within normal limits. Both lungs are clear. The visualized skeletal structures are unremarkable. IMPRESSION: No acute abnormality of the lungs in AP portable projection. Electronically Signed   By: Eddie Candle M.D.   On: 08/31/2019 16:42    Procedures Procedures (including critical care time)  Medications Ordered in ED Medications - No data to display   Initial Impression / Assessment and Plan / ED Course  I have reviewed the triage vital signs and the nursing notes.  Pertinent labs & imaging results that were available during my care of the patient were reviewed by me and considered in my medical decision making (see chart for details).   Patient returns to the emergency department for continued cough and shortness of breath in the setting of several other symptoms including nasal congestion, body aches, & low grade fevers.  Seen in the ED 2 days prior, normal chest x-ray, negative COVID 12, reassuring labs without significant leukocytosis, anemia or electrolyte derangement.  Given steroids and an inhaler which she has been taking without significant improvement.  On exam she is nontoxic-appearing, no apparent distress, vitals  without significant abnormality.  Sinuses are nontender, she is afebrile, doubt acute bacterial sinusitis.  Oropharynx is clear, no longer has a sore throat.  TMs are clear.  Lungs were clear to auscultation bilaterally, no wheezing, no respiratory distress.  Repeat chest x-ray without acute abnormality-no infiltrates to suggest bacterial pneumonia, additionally no pneumothorax or findings of CHF.  Do not feel that further testing is needed to be repeated given she has had multiple negative tests.  Influenza swabs obtained and negative.  PERC negative, doubt PE.  Ambulatory SpO2 > 98%. Patient feeling somewhat improved following DuoNeb. Unclear definitive etiology, remains likely viral, recommend completion of steroid burst, continue albuterol, will given flonase & tessalon, recommended PCP follow up. I discussed results, treatment plan, need for follow-up, and return precautions with the patient. Provided opportunity for questions, patient confirmed understanding and is in agreement with plan.    Findings and plan of care discussed with supervising physician Dr. Rogene Houston who is in agreement.   Final Clinical Impressions(s) / ED Diagnoses   Final diagnoses:  Shortness of breath    ED Discharge Orders         Ordered    fluticasone (FLONASE) 50 MCG/ACT nasal spray  Daily     09/02/19 1855    benzonatate (TESSALON) 100 MG capsule  Every 8 hours     09/02/19 1855           Amaryllis Dyke, PA-C 09/02/19 1857    Fredia Sorrow, MD 09/11/19 [phone]

## 2019-09-02 NOTE — Discharge Instructions (Addendum)
You were seen in the emergency department today for continued trouble breathing.  Your chest x-ray was normal.  Your influenza swabs were negative.  At this time we still suspect that this is likely a viral illness--> we recommend that you continue and complete your steroid pack.  Please continue to use your albuterol inhaler.  We are sending home with Tessalon to take every 8 hours as needed for coughing (start with 1 tablet every 8 hours, this is not helping you may take 2 tablets every 8 hours) as well as Flonase to use 1 spray per nostril daily as needed for congestion.  We have prescribed you new medication(s) today. Discuss the medications prescribed today with your pharmacist as they can have adverse effects and interactions with your other medicines including over the counter and prescribed medications. Seek medical evaluation if you start to experience new or abnormal symptoms after taking one of these medicines, seek care immediately if you start to experience difficulty breathing, feeling of your throat closing, facial swelling, or rash as these could be indications of a more serious allergic reaction  Please follow-up with your primary care provider within 3 days.  Return to the ER for new or worsening symptoms including but not limited to worsening shortness of breath, chest pain not related to coughing, passing out, coughing up blood, or any other concerns.

## 2019-09-02 NOTE — Telephone Encounter (Signed)
Pt c/o worsening cough. Pt cough is productive with white phlegm, pt having coughing episodes lasting 15 minutes. Pt was evaluated and is currently on her 3rd day of steroids. Pt was prescribed an inhaler which she is taking every 1 to 4 hours but is not helping with her constant wheezing. Pt advised to go to [facility] for reevaluation. Pt verbalized understanding.

## 2019-09-02 NOTE — ED Notes (Signed)
Respiratory in to treat

## 2019-09-02 NOTE — ED Notes (Signed)
Pt reports she is improved after breathing treatment  She also reports that she has gone from smoking 3 PPD to 1/2 PPD of cigarettes and hopes to find out what is wrong

## 2019-09-02 NOTE — ED Notes (Signed)
Call re   Rad report of CRX ?

## 2019-10-28 ENCOUNTER — Other Ambulatory Visit: Payer: Self-pay

## 2019-10-28 ENCOUNTER — Emergency Department (HOSPITAL_COMMUNITY): Payer: Self-pay

## 2019-10-28 ENCOUNTER — Emergency Department (HOSPITAL_COMMUNITY)
Admission: EM | Admit: 2019-10-28 | Discharge: 2019-10-28 | Disposition: A | Discharge status: Home / Self Care | Payer: Self-pay | Attending: Emergency Medicine | Admitting: Emergency Medicine

## 2019-10-28 DIAGNOSIS — F17210 Nicotine dependence, cigarettes, uncomplicated: Secondary | ICD-10-CM | POA: Insufficient documentation

## 2019-10-28 DIAGNOSIS — Y999 Unspecified external cause status: Secondary | ICD-10-CM | POA: Insufficient documentation

## 2019-10-28 DIAGNOSIS — Y929 Unspecified place or not applicable: Secondary | ICD-10-CM | POA: Insufficient documentation

## 2019-10-28 DIAGNOSIS — Y939 Activity, unspecified: Secondary | ICD-10-CM | POA: Insufficient documentation

## 2019-10-28 DIAGNOSIS — Z79899 Other long term (current) drug therapy: Secondary | ICD-10-CM | POA: Insufficient documentation

## 2019-10-28 DIAGNOSIS — X58XXXA Exposure to other specified factors, initial encounter: Secondary | ICD-10-CM | POA: Insufficient documentation

## 2019-10-28 DIAGNOSIS — S82311A Torus fracture of lower end of right tibia, initial encounter for closed fracture: Secondary | ICD-10-CM | POA: Insufficient documentation

## 2019-10-28 MED ORDER — MORPHINE SULFATE 15 MG PO TABS: 15.0000 mg | ORAL_TABLET | ORAL | 0 refills | Status: DC | PRN

## 2019-10-28 MED ORDER — ONDANSETRON HCL 4 MG/2ML IJ SOLN
4.0000 mg | Freq: Once | INTRAMUSCULAR | Status: AC
  Administered 2019-10-28: 4 mg via INTRAVENOUS
  Filled 2019-10-28: qty 2

## 2019-10-28 MED ORDER — FENTANYL CITRATE (PF) 100 MCG/2ML IJ SOLN
100.0000 ug | Freq: Once | INTRAMUSCULAR | Status: AC
  Administered 2019-10-28: 100 ug via INTRAVENOUS
  Filled 2019-10-28: qty 2

## 2019-10-28 MED ORDER — HYDROMORPHONE HCL 1 MG/ML IJ SOLN
1.0000 mg | Freq: Once | INTRAMUSCULAR | Status: AC
  Administered 2019-10-28: 1 mg via INTRAVENOUS
  Filled 2019-10-28: qty 1

## 2019-10-28 MED ORDER — KETOROLAC TROMETHAMINE 30 MG/ML IJ SOLN
15.0000 mg | Freq: Once | INTRAMUSCULAR | Status: AC
  Administered 2019-10-28: 15 mg via INTRAVENOUS
  Filled 2019-10-28: qty 1

## 2019-10-28 MED ORDER — MORPHINE SULFATE 15 MG PO TABS: 15.0000 mg | ORAL_TABLET | ORAL | 0 refills | Status: AC | PRN

## 2019-10-28 NOTE — ED Provider Notes (Signed)
[facility]-EMERGENCY DEPT Provider Note   CSN: [id number] Arrival date & time: 10/28/19  0121     History Chief Complaint  Patient presents with  . Ankle Pain    Danielle Morales is a 48 y.o. female.  48 yo F with a chief complaints of right leg pain.  The patient was ambulating and felt a pop to her leg.  Felt sudden severe pain and was unable to continue to walk.  Patient had a open fracture in the area back about 35 years ago and ended up having a surgical repair.  Had a steel rod placed.  She thinks that it is fractured.  The history is provided by the patient.  Ankle Pain Location:  Leg Leg location:  L leg Pain details:    Quality:  Sharp and shooting   Radiates to:  L leg   Severity:  Severe   Onset quality:  Sudden   Duration:  1 hour   Timing:  Constant   Progression:  Worsening Chronicity:  New Prior injury to area:  Yes Relieved by:  Nothing Worsened by:  Bearing weight Ineffective treatments:  None tried Associated symptoms: no fever        No past medical history on file.  There are no problems to display for this patient.   Past Surgical History:  Procedure Laterality Date  . BREAST LUMPECTOMY    . LEG SURGERY    . TUBAL LIGATION       OB History    Gravida      Para      Term      Preterm      AB      Living  6     SAB      TAB      Ectopic      Multiple      Live Births              No family history on file.  Social History   Tobacco Use  . Smoking status: Current Every Day Smoker    Packs/day: 0.50    Types: Cigarettes  . Smokeless tobacco: Never Used  Substance Use Topics  . Alcohol use: No  . Drug use: No    Home Medications Prior to Admission medications   Medication Sig Start Date End Date Taking? Authorizing Provider  benzonatate (TESSALON) 100 MG capsule Take 1 capsule (100 mg total) by mouth every 8 (eight) hours. 09/02/19   Petrucelli, Samantha R, PA-C  fluticasone  (FLONASE) 50 MCG/ACT nasal spray Place 1 spray into both nostrils daily. 09/02/19   Petrucelli, Samantha R, PA-C  ibuprofen (ADVIL,MOTRIN) 600 MG tablet Take 1 tablet (600 mg total) by mouth every 6 (six) hours as needed. 07/21/17   Audry PiliMohr, Tyler, PA-C  metoCLOPramide (REGLAN) 10 MG tablet Take 1 tablet (10 mg total) by mouth every 8 (eight) hours as needed for nausea or vomiting. 04/05/17   Trixie DredgeWest, Emily, PA-C  morphine (MSIR) 15 MG tablet Take 1 tablet (15 mg total) by mouth every 4 (four) hours as needed for severe pain. 10/28/19   Melene PlanFloyd, Shandreka Dante, DO  pantoprazole (PROTONIX) 20 MG tablet Take 1 tablet (20 mg total) by mouth daily. 04/05/17   Trixie DredgeWest, Emily, PA-C  predniSONE (DELTASONE) 10 MG tablet Take 4 tablets (40 mg total) by mouth daily. 08/31/19   Vanetta MuldersZackowski, Scott, MD  sucralfate (CARAFATE) 1 g tablet Take 1 tablet (1 g total) by mouth 4 (four) times  daily -  with meals and at bedtime. 04/05/17   Clayton Bibles, PA-C  sulfamethoxazole-trimethoprim (BACTRIM DS,SEPTRA DS) 800-160 MG tablet Take 1 tablet by mouth 2 (two) times daily. X 3 days 04/05/17   Clayton Bibles, PA-C  zinc gluconate 50 MG tablet Take 1 tablet (50 mg total) by mouth daily. 08/31/19   Fredia Sorrow, MD    Allergies    Ciprofloxacin  Review of Systems   Review of Systems  Constitutional: Negative for chills and fever.  HENT: Negative for congestion and rhinorrhea.   Eyes: Negative for redness and visual disturbance.  Respiratory: Negative for shortness of breath and wheezing.   Cardiovascular: Negative for chest pain and palpitations.  Gastrointestinal: Negative for nausea and vomiting.  Genitourinary: Negative for dysuria and urgency.  Musculoskeletal: Positive for arthralgias and myalgias.  Skin: Negative for pallor and wound.  Neurological: Negative for dizziness and headaches.    Physical Exam Updated Vital Signs BP 114/63 (BP Location: Right Arm)   Pulse 100   Temp 98.1 F (36.7 C) (Oral)   Resp 18   Ht 5\' 4"  (1.626 m)    Wt 130 kg   SpO2 93%   BMI 49.19 kg/m   Physical Exam Vitals and nursing note reviewed.  Constitutional:      General: She is not in acute distress.    Appearance: She is well-developed. She is not diaphoretic.  HENT:     Head: Normocephalic and atraumatic.  Eyes:     Pupils: Pupils are equal, round, and reactive to light.  Cardiovascular:     Rate and Rhythm: Normal rate and regular rhythm.     Heart sounds: No murmur. No friction rub. No gallop.   Pulmonary:     Effort: Pulmonary effort is normal.     Breath sounds: No wheezing or rales.  Abdominal:     General: There is no distension.     Palpations: Abdomen is soft.     Tenderness: There is no abdominal tenderness.  Musculoskeletal:        General: No tenderness.     Cervical back: Normal range of motion and neck supple.     Comments: Old surgical scar at the distal one third of the tib-fib.  Pain and swelling to the area.  Crepitus noted at the fibular neck.  She has some decreased sensation in the webspace between the first and second digit.  Otherwise pulse motor and sensation are intact.  Skin:    General: Skin is warm and dry.  Neurological:     Mental Status: She is alert and oriented to person, place, and time.  Psychiatric:        Behavior: Behavior normal.     ED Results / Procedures / Treatments   Labs (all labs ordered are listed, but only abnormal results are displayed) Labs Reviewed - No data to display  EKG None  Radiology DG Tibia/Fibula Right  Result Date: 10/28/2019 CLINICAL DATA:  Recent twisting injury with lower leg deformity EXAM: RIGHT TIBIA AND FIBULA - 2 VIEW COMPARISON:  07/21/2017 FINDINGS: Tibial medullary rod is noted. Old healed fracture is noted in the midshaft of the tibia with an oblique fracture in the distal diaphysis tibia. Proximal fibular fracture is seen as well as the previously described distal fibular fracture. Healed midshaft fracture in the fibula is seen. IMPRESSION:  Proximal and distal fibular fractures with evidence of an oblique fracture through the distal tibia surrounding the medullary rod. Electronically Signed   By:  Alcide Clever M.D.   On: 10/28/2019 02:21   DG Ankle Complete Right  Result Date: 10/28/2019 CLINICAL DATA:  Twisting injury with ankle pain, initial encounter EXAM: RIGHT ANKLE - COMPLETE 3+ VIEW COMPARISON:  07/21/2017 FINDINGS: Medullary rod is noted within the distal femur. Oblique fractures are seen through the distal tibial shaft surrounding the prosthesis as well as within the distal fibular diaphysis. Lateral angulation at the fracture site is noted. Slight posterior angulation is noted as well. Degenerative changes of the tarsal bones are noted. IMPRESSION: Oblique fracture through the distal diaphysis of the right tibia and fibula. Electronically Signed   By: Alcide Clever M.D.   On: 10/28/2019 02:19    Procedures Procedures (including critical care time)  Medications Ordered in ED Medications  fentaNYL (SUBLIMAZE) injection 100 mcg (100 mcg Intravenous Given 10/28/19 0147)  HYDROmorphone (DILAUDID) injection 1 mg (1 mg Intravenous Given 10/28/19 0211)  ondansetron (ZOFRAN) injection 4 mg (4 mg Intravenous Given 10/28/19 0213)  ketorolac (TORADOL) 30 MG/ML injection 15 mg (15 mg Intravenous Given 10/28/19 0314)  HYDROmorphone (DILAUDID) injection 1 mg (1 mg Intravenous Given 10/28/19 0413)    ED Course  I have reviewed the triage vital signs and the nursing notes.  Pertinent labs & imaging results that were available during my care of the patient were reviewed by me and considered in my medical decision making (see chart for details).    MDM Rules/Calculators/A&P     CHA2DS2/VAS Stroke Risk Points      N/A >= 2 Points: High Risk  1 - 1.99 Points: Medium Risk  0 Points: Low Risk    A final score could not be computed because of missing components.: Last  Change: N/A     This score determines the patient's risk of having  a stroke if the  patient has atrial fibrillation.      This score is not applicable to this patient. Components are not  calculated.                   48 yo F with a chief complaints of right lower leg pain.  Patient has an old fracture to the area and thinks that it broke again.  Occurred just with ambulation.  Patient is in a significant amount of pain on my initial exam.  Will attempt to control the patient's pain.  Obtain plain film.  Discussed with Dr. Ophelia Charter, recommend cam walker, crutches, elevation.  Follow up in the office.   4:35 AM:  I have discussed the diagnosis/risks/treatment options with the patient and believe the pt to be eligible for discharge home to follow-up with Ortho. We also discussed returning to the ED immediately if new or worsening sx occur. We discussed the sx which are most concerning (e.g., sudden worsening pain, fever, inability to tolerate by mouth) that necessitate immediate return. Medications administered to the patient during their visit and any new prescriptions provided to the patient are listed below.  Medications given during this visit Medications  fentaNYL (SUBLIMAZE) injection 100 mcg (100 mcg Intravenous Given 10/28/19 0147)  HYDROmorphone (DILAUDID) injection 1 mg (1 mg Intravenous Given 10/28/19 0211)  ondansetron (ZOFRAN) injection 4 mg (4 mg Intravenous Given 10/28/19 0213)  ketorolac (TORADOL) 30 MG/ML injection 15 mg (15 mg Intravenous Given 10/28/19 0314)  HYDROmorphone (DILAUDID) injection 1 mg (1 mg Intravenous Given 10/28/19 0413)     The patient appears reasonably screen and/or stabilized for discharge and I doubt any other medical condition or  other EMC requiring further screening, evaluation, or treatment in the ED at this time prior to discharge.   Final Clinical Impression(s) / ED Diagnoses Final diagnoses:  Closed torus fracture of distal end of right tibia, initial encounter    Rx / DC Orders ED Discharge Orders          Ordered    morphine (MSIR) 15 MG tablet  Every 4 hours PRN,   Status:  Discontinued     10/28/19 0241    morphine (MSIR) 15 MG tablet  Every 4 hours PRN     10/28/19 0414           Melene Plan, DO 10/28/19 [phone]

## 2019-10-28 NOTE — ED Triage Notes (Signed)
Pt presents to the ED via [facility] coming from home after stepping off a curb and twisting her ankle heard a felt a "pop" pt has had ankle surgery and rods placed a few years ago. EMS reports that ankle is obviously deformed. Gave 100 mcg fentanyl and 20 mg ketamine en route. With about 10 minutes of pain relief after the ketamine.

## 2019-10-28 NOTE — Discharge Instructions (Signed)
Try to keep your weight off of the ankle.  Keep it elevated above your heart.  Follow up in the office with the orthopedic doc.   Take 4 over the counter ibuprofen tablets 3 times a day or 2 over-the-counter naproxen tablets twice a day for pain. Also take tylenol 1000mg (2 extra strength) four times a day.   Then take the pain medicine if you feel like you need it. Narcotics do not help with the pain, they only make you care about it less.  You can become addicted to this, people may break into your house to steal it.  It will constipate you.  If you drive under the influence of this medicine you can get a DUI.

## 2024-06-20 NOTE — ED Provider Notes (Signed)
 [facility]  ED Provider Note  Danielle Morales 53 y.o. female DOB: 05/12/1971 MRN: [id number] History   Chief Complaint  Patient presents with  . Abdominal Pain    Upper abd pain x3 days, worse tonight. Endorses N/V.   Patient presents complaining of a 3-day history of upper abdominal pain which is increased in severity/pain is no severe with associated nausea no vomiting diarrhea constipation  Past medical history of diabetes, hyperlipidemia  Medicines, see list  Allergies, Cipro  Smoke yes alcohol none  Operation tibia/fibula fracture, bilateral tube location        Past Medical History:  Diagnosis Date  . Allergy    Cipro  . Anxiety    My whole life  . Depression    years ago  . Diabetes mellitus, type 2 (*)   . Emphysema of lung (*)    Had in 2019  . GERD (gastroesophageal reflux disease)    Most of my life  . History of 2019 novel coronavirus disease (COVID-19)     Past Surgical History:  Procedure Laterality Date  . Breast biopsy Right 2017   benign per pt  . Cosmetic surgery     1986  . Fracture surgery  1986/2020   Broke same leg twice  . Leg surgery     x 2  . Lymph node biopsy     Breast biopsy  . Tubal ligation      Social History   Substance and Sexual Activity  Alcohol Use Yes   Comment: I might drink every few months. Not a big drinker   Tobacco Use History[1] E-Cigarettes  . Vaping Use Never User   . Start Date    . Cartridges/Day    . Quit Date     Social History   Substance and Sexual Activity  Drug Use Never         Allergies[2]  Home Medications   ACETAMINOPHEN  (TYLENOL ) 500 MG TABLET    Take one tablet (500 mg dose) by mouth every 6 (six) hours as needed for Pain.   B COMPLEX VITAMINS CAPSULE    Take one capsule by mouth daily.   CALCIUM 600-200 MG-UNIT PER TABLET    Take one tablet by mouth daily.   CINNAMON PO    Take by mouth.   GARLIC PO    Take by mouth.   IBUPROFEN   (ADVIL ,MOTRIN ) 600 MG TABLET    Take one tablet (600 mg dose) by mouth every 6 (six) hours as needed for Pain.   MELOXICAM (MOBIC) 15 MG TABLET    TAKE ONE TABLET (15 MG DOSE) BY MOUTH DAILY.   METFORMIN ER (GLUCOPHAGE-XR) 500 MG 24 HR TABLET    Take one tablet (500 mg dose) by mouth with breakfast.   MULTIPLE VITAMINS-MINERALS (ONE DAILY MULTIVITAMIN WOMEN) TABLET    Take by mouth.   POTASSIUM PO    Take by mouth.   ROSUVASTATIN CALCIUM (CRESTOR) 10 MG TABLET    Take one tablet (10 mg dose) by mouth at bedtime.    Primary Survey  Primary Survey  Review of Systems   Review of Systems  Constitutional:  Negative for chills and fever.  Respiratory:  Negative for cough.   Cardiovascular:  Negative for chest pain.  Gastrointestinal:  Positive for abdominal pain and nausea. Negative for constipation, diarrhea and vomiting.  All other systems reviewed and are negative.   Physical Exam   ED Triage Vitals  BP 06/20/24 0445 132/60  Heart Rate 06/20/24 0445 86  Resp 06/20/24 0445 18  SpO2 06/20/24 0445 97 %  Temp 06/20/24 0446 98.5 F (36.9 C)    Physical Exam  Nursing note and vitals reviewed. Constitutional: She appears well-developed. She appears to be in pain.  HENT:  Head: Normocephalic and atraumatic.  Eyes: EOM are intact. Pupils are equal, round, and reactive to light.  Neck: Normal range of motion. Neck supple.  Cardiovascular: Normal rate and regular rhythm.  Pulmonary/Chest: Respiratory effort normal and breath sounds normal.  Abdominal: There is moderate abdominal tenderness in the upper abdominal quadrants. There is no guarding and no rebound.  Musculoskeletal: Normal range of motion.     Cervical back: Normal range of motion and neck supple.   Neurological: She is alert.  Skin: Skin is warm.  Psychiatric: She has a normal mood and affect.     ED Course   Lab results:   CBC AND DIFFERENTIAL - Abnormal      Result Value   WBC 10.8 (*)    RBC 5.26 (*)    HGB  15.5     HCT 46.0 (*)    MCV 87.5     MCH 29.5     MCHC 33.7     Plt Ct 293     RDW SD 41.8     MPV 10.2     NRBC% 0.0     Absolute NRBC Count 0.00     NEUTROPHIL % 65.5     LYMPHOCYTE % 23.4     MONOCYTE % 6.5     Eosinophil % 3.0     BASOPHIL % 1.2     IG% 0.4     ABSOLUTE NEUTROPHIL COUNT 7.07     ABSOLUTE LYMPHOCYTE COUNT 2.53     Absolute Monocyte Count 0.70     Absolute Eosinophil Count 0.32     Absolute Basophil Count 0.13     Absolute Immature Granulocyte Count 0.04 (*)   COMPREHENSIVE METABOLIC PANEL - Abnormal   Na 138     Potassium 4.6     Cl 100     CO2 24     AGAP 14     Glucose 128 (*)    BUN 15     Creatinine 0.62     Ca 9.4     ALK PHOS 106     T Bili 0.6     Total Protein 7.5     Alb 4.3     GLOBULIN 3.2     ALBUMIN/GLOBULIN RATIO 1.3     BUN/CREAT RATIO 24.2     ALT 25     AST 20     Comment: Slight hemolysis present, results may be adversely affected.  Please interpret results with caution.     eGFR 107     Comment: Normal GFR (glomerular filtration rate) > 60 mL/min/1.73 meters squared, < 60 may include impaired kidney function. Calculation based on the Chronic Kidney Disease Epidemiology Collaboration (CK-EPI)equation refit without adjustment for race.  URINALYSIS ONLY - NO SYMPTOMS - Abnormal   Urine Color Yellow     Urine Clarity Clear     Urine Specific Gravity >1.050 (*)    Urine pH 6.5     Urine Protein - Dipstick Negative     Urine Glucose Negative     Urine Ketones Negative     Urine Bilirubin Negative     Urine Blood Negative     Urine Nitrite Negative  Urine Urobilinogen <2     Urine Leukocyte Esterase Negative     UA Microscopic No Micro    LIPASE - Normal   Lipase 25    PROTIME-INR - Normal   PT 12.1     Comment: Many commonly administered drugs may affect the results obtained in PT and PTT testing.   INR 0.9     Comment: INR Therapeutic Range for DVT, PE:      2.0 - 3.0 INR Mechanical Prosthetic Heart Valves: 2.5  - 3.5  LACTIC ACID - Normal   Lactic Acid 1.3      Imaging:   CT ABDOMEN PELVIS W IV CONTRAST   Narrative:    CT abdomen and pelvis with contrast:  INDICATION:  Abdominal Pain.  TECHNIQUE: Axial scans were performed through the abdomen and pelvis with intravenous contrast. Contrast-100 mL mL Isovue-370. Radiation dose reduction was utilized (automated exposure control, mA or kV adjustment based on patient size, or iterative image reconstruction).  COMPARISON: None  FINDINGS: Lower chest: Normal  Liver: Normal Biliary: Normal Spleen: Normal Pancreas: Normal Adrenals: Normal Kidneys: Normal  Stomach: Normal Small bowel: Normal Colon: Marked inflammatory change along the distal transverse colon with moderate wall thickening. Questionable wall thickening at the splenic flexure and along the descending colon. Mild to moderate diverticulosis without diverticulitis. Appendix: Normal  Lymph nodes: Normal Free fluid: Absent Mesentery/Retroperitoneum: Normal Vascular: Normal  Reproductive: Normal Urinary bladder: Normal  Osseous: Within normal limits Soft tissues: Normal     Impression:    IMPRESSION:   1. Focal moderate colitis of the distal transverse colon and splenic flexure. 2. Mild diverticulosis without diverticulitis.  Electronically Signed by: Dorn Burkes on 06/20/2024 6:20 AM      ECG: ECG Results   None                                                                     Pre-Sedation Procedures    Medical Decision Making Upper abdominal pain for the last 3 days increasing in severity nausea but no vomiting or diarrhea  Laboratory studies unremarkable CT scan shows some focal colitis otherwise unremarkable  Supportive care clear liquid diet Will provide Zofran  and Bentyl for symptoms Follow-up with primary care physician/consider colonoscopy if symptoms persist  Amount and/or Complexity of Data  Reviewed Labs: ordered. Radiology: ordered.  Risk Prescription drug management.            Provider Communication  New Prescriptions   DICYCLOMINE (BENTYL) 20 MG TABLET    Take one tablet (20 mg dose) by mouth 4 (four) times a day as needed.      Quantity: 12 tablet    Refills: 0   ONDANSETRON  (ZOFRAN -ODT) 4 MG DISINTEGRATING TABLET    Take one tablet (4 mg dose) by mouth 4 (four) times a day as needed for Nausea.      Quantity: 10 tablet    Refills: 0    Modified Medications   No medications on file    Discontinued Medications   No medications on file    Clinical Impression Final diagnoses:  Pain of upper abdomen  Colitis    ED Disposition     ED Disposition  Discharge   Condition  Stable   Comment  --  Electronically signed by:       [1] Social History Tobacco Use  Smoking Status Never  . Passive exposure: Yes  Smokeless Tobacco Never  [2] Allergies Allergen Reactions  . Ciprofloxacin Nausea And Vomiting    Severe hallucinations and N/V    Lynwood JAYSON Life, MD 06/20/24 [phone]

## 2024-10-04 ENCOUNTER — Other Ambulatory Visit: Payer: Self-pay

## 2024-10-04 ENCOUNTER — Emergency Department (HOSPITAL_BASED_OUTPATIENT_CLINIC_OR_DEPARTMENT_OTHER)
Admission: EM | Admit: 2024-10-04 | Discharge: 2024-10-04 | Disposition: A | Discharge status: Home / Self Care | Attending: Emergency Medicine | Admitting: Emergency Medicine

## 2024-10-04 ENCOUNTER — Encounter (HOSPITAL_BASED_OUTPATIENT_CLINIC_OR_DEPARTMENT_OTHER): Payer: Self-pay | Admitting: Urology

## 2024-10-04 ENCOUNTER — Emergency Department (HOSPITAL_BASED_OUTPATIENT_CLINIC_OR_DEPARTMENT_OTHER)

## 2024-10-04 ENCOUNTER — Other Ambulatory Visit (HOSPITAL_BASED_OUTPATIENT_CLINIC_OR_DEPARTMENT_OTHER): Payer: Self-pay

## 2024-10-04 DIAGNOSIS — M25561 Pain in right knee: Secondary | ICD-10-CM | POA: Diagnosis present

## 2024-10-04 DIAGNOSIS — E119 Type 2 diabetes mellitus without complications: Secondary | ICD-10-CM | POA: Insufficient documentation

## 2024-10-04 DIAGNOSIS — F17210 Nicotine dependence, cigarettes, uncomplicated: Secondary | ICD-10-CM | POA: Insufficient documentation

## 2024-10-04 MED ORDER — HYDROCODONE-ACETAMINOPHEN 5-325 MG PO TABS
2.0000 | ORAL_TABLET | ORAL | 0 refills | Status: AC | PRN
  Filled 2024-10-04: qty 10, 2d supply, fill #0

## 2024-10-04 MED ORDER — ACETAMINOPHEN 500 MG PO TABS
1000.0000 mg | ORAL_TABLET | Freq: Once | ORAL | Status: AC
  Administered 2024-10-04: 1000 mg via ORAL
  Filled 2024-10-04: qty 2

## 2024-10-04 MED ORDER — OXYCODONE HCL 5 MG PO TABS
5.0000 mg | ORAL_TABLET | Freq: Once | ORAL | Status: AC
  Administered 2024-10-04: 5 mg via ORAL
  Filled 2024-10-04: qty 1

## 2024-10-04 NOTE — Discharge Instructions (Addendum)
 It was a pleasure caring for you today in the emergency department.  You were seen for right knee pain and had x-rays which did not show any acute fractures.  It is possible you have a ligamentous injury therefore we placed you in a knee immobilizer to wear at all times.  You can utilize the Norco as needed for pain and take an additional tablet of Tylenol  with each dose to help with your symptoms.  Please follow-up with orthopedic surgeon Dr. Barton with [facility] or you can follow-up with the orthopedic doctor you have seen previously.  Please return to the emergency department for any worsening or worrisome symptoms.

## 2024-10-04 NOTE — ED Notes (Signed)
 Pt transported to imaging.

## 2024-10-04 NOTE — ED Provider Notes (Signed)
 [place] [facility] Provider Note  CSN: [id number] Arrival date & time: 10/04/24 1016  Chief Complaint(s) Knee Pain  HPI Danielle Morales is a 53 y.o. female with past medical history as below, significant for T2DM, HLD, GERD who presents to the ED with complaint of R knee pain.  States her right knee has been hurting since Monday but yesterday noted she was walking up a ramp at work and pivoted with her right knee and noticed immediate pop.  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  Attempted to go to work today took an additional 4 ibuprofen , reports the pain was too severe to push through.  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  Reports multiple prior surgeries to right lower extremity including rod placement after a compound tib-fib fracture approximately 20 years ago and spiral fracture of right tibia in 2020 necessitating hardware revision.  Reports she followed up with orthopedics at [facility], Dr. Theophilus a couple of months back and noted to have screw that was snapped but has not been able to follow-up.  Past Medical History History reviewed. No pertinent past medical history. There are no active problems to display for this patient.  Home Medication(s) Prior to Admission medications   Medication Sig Start Date End Date Taking? Authorizing Provider  HYDROcodone -acetaminophen  (NORCO/VICODIN) 5-325 MG tablet Take 2 tablets by mouth every 4 (four) hours as needed. 10/04/24  Yes Theophilus Pagan, MD  benzonatate  (TESSALON ) 100 MG capsule Take 1 capsule (100 mg total) by mouth every 8 (eight) hours. 09/02/19   Petrucelli, Samantha R, PA-C  fluticasone  (FLONASE ) 50 MCG/ACT nasal spray Place 1 spray into both nostrils daily. 09/02/19   Petrucelli, Samantha R, PA-C  ibuprofen  (ADVIL ,MOTRIN ) 600 MG tablet  Take 1 tablet (600 mg total) by mouth every 6 (six) hours as needed. 07/21/17   Olympia Gee, PA-C  metoCLOPramide  (REGLAN ) 10 MG tablet Take 1 tablet (10 mg total) by mouth every 8 (eight) hours as needed for nausea or vomiting. 04/05/17   Devora Perkins, PA-C  morphine  (MSIR) 15 MG tablet Take 1 tablet (15 mg total) by mouth every 4 (four) hours as needed for severe pain. 10/28/19   Emil Share, DO  pantoprazole  (PROTONIX ) 20 MG tablet Take 1 tablet (20 mg total) by mouth daily. 04/05/17   Devora Perkins, PA-C  predniSONE  (DELTASONE ) 10 MG tablet Take 4 tablets (40 mg total) by mouth daily. 08/31/19   Zackowski, Scott, MD  sucralfate  (CARAFATE ) 1 g tablet Take 1 tablet (1 g total) by mouth 4 (four) times daily -  with meals and at bedtime. 04/05/17   Devora Perkins, PA-C  sulfamethoxazole -trimethoprim  (BACTRIM  DS,SEPTRA  DS) 800-160 MG tablet Take 1 tablet by mouth 2 (two) times daily. X 3 days 04/05/17   Devora Perkins, PA-C  zinc  gluconate 50 MG tablet Take 1 tablet (50 mg total) by mouth daily. 08/31/19   Zackowski, Scott, MD  Past Surgical History Past Surgical History:  Procedure Laterality Date   BREAST LUMPECTOMY     LEG SURGERY     TUBAL LIGATION     Family History History reviewed. No pertinent family history.  Social History Social History   Tobacco Use   Smoking status: Every Day    Current packs/day: 0.50    Types: Cigarettes   Smokeless tobacco: Never  Vaping Use   Vaping status: Never Used  Substance Use Topics   Alcohol use: No   Drug use: No   Allergies Ciprofloxacin  Review of Systems A thorough review of systems was obtained and all systems are negative except as noted in the HPI and PMH.   Physical Exam Vital Signs  I have reviewed the triage vital signs BP (!) 110/57 (BP Location: Right Arm)   Pulse 73   Temp 97.8 F (36.6 C) (Oral)   Resp 20    Ht 5' 4 (1.626 m)   Wt 130 kg   LMP 06/30/2019   SpO2 97%   BMI 49.19 kg/m  Physical Exam General: Intermittently grimacing in pain.  Alert. NAD CV: RRR without murmur Pulm: CTAB. Normal WOB on RA. No wheezing Abdomen: Soft, non-tender, non-distended. +BS MSK: Right lower extremity: -No deformity, erythema or ecchymosis noted -TTP over lateral, distal femur extending down to lateral/posterior right calf. -Nontender to palpation over joint line -Unable to tolerate anterior, posterior, varus and valgus testing secondary to pain -Loss of sensation over plantar and dorsal surface of right foot -Able to wiggle toes with 2+ dorsalis pedis and posterior tibialis pulses. Ext: Well perfused. Cap refill < 3 seconds Skin: Warm, dry. No rashes noted  ED Results and Treatments Labs (all labs ordered are listed, but only abnormal results are displayed) Labs Reviewed - No data to display                                                                                                                        Radiology DG Knee Complete 4 Views Right Result Date: 10/04/2024 CLINICAL DATA:  Pain behind right knee. Patient heard pop in right knee yesterday. EXAM: DG KNEE COMPLETE 4+V*R* COMPARISON:  10/04/2024 FINDINGS: Partially visualized fixation hardware over the tibia is intact. Old proximal right fibular diaphyseal fracture. Remaining bones, joint spaces and soft tissues over the right knee are normal. IMPRESSION: 1. No acute findings. 2. Old proximal right fibular diaphyseal fracture. Electronically Signed   By: Toribio Agreste M.D.   On: 10/04/2024 12:35   DG Tibia/Fibula Right Result Date: 10/04/2024 EXAM: VIEW(S) XRAY OF THE RIGHT TIBIA AND FIBULA 10/04/2024 11:29:58 AM COMPARISON: 10/28/2019 CLINICAL HISTORY: Right calf pain, prior hardware. FINDINGS: BONES AND JOINTS: Intramedullary rod and new large medial plate and multiple screws are present in the right tibia. Healed fracture deformities  of the right tibia and fibula. No acute fracture noted. Moderate osteoarthritis of the right ankle is present. SOFT TISSUES: The soft tissues are unremarkable. IMPRESSION: 1. No acute fracture.  2. Healed fracture deformities of the right tibia and fibula with intramedullary rod and new large medial plate and multiple screws in the right tibia. 3. Moderate osteoarthritis of the right ankle. Electronically signed by: Oneil Devonshire MD 10/04/2024 12:26 PM EST RP Workstation: [id number]    Pertinent labs & imaging results that were available during my care of the patient were reviewed by me and considered in my medical decision making (see MDM for details).  Medications Ordered in ED Medications  acetaminophen  (TYLENOL ) tablet 1,000 mg (1,000 mg Oral Given 10/04/24 1112)  oxyCODONE  (Oxy IR/ROXICODONE ) immediate release tablet 5 mg (5 mg Oral Given 10/04/24 1112)                                                                   Medical Decision Making / ED Course    Medical Decision Making:    JACQUELYNNE GUEDES is a 53 y.o. female with past medical history as below, significant for T2DM, HLD, GERD who presents to the ED with complaint of R knee pain. The complaint involves an extensive differential diagnosis and also carries with it a high risk of complications and morbidity.  Serious etiology was considered. Ddx includes but is not limited to: Fracture, strain, ligamentous tear, meniscal injury  Complete initial physical exam performed, notably the patient was in mild distress intermittently secondary to pain. Reviewed and confirmed nursing documentation for past medical history, family history, social history.  Vital signs reviewed.    53 year old female with history as above presenting with acute on chronic right knee pain in the setting of multiple prior surgical revisions of her right tib/fib.  Acute injury with popping sensation noted yesterday concerning for muscle/ligamentous injury with  pain more notably on the lateral/posterior right knee.  Will obtain right knee and tip/fib XR to evaluate for secondary fracture or hardware malfunction.  Initiate pain management with Tylenol  and oxycodone .   Clinical Course as of 10/04/24 1248  Thu Oct 04, 2024  1240 Right knee and tib-fib XR negative for acute fracture, showed old/healed tib-fib fractures with hardware in place.  Will place in knee immobilizer and provide with pain management and orthopedic follow-up upon discharge. [KH]    Clinical Course User Index [KH] Theophilus Pagan, MD      Additional history obtained: -External records from outside source obtained and reviewed including: Chart review including previous notes, labs, imaging, consultation notes including: [facility] notes  Imaging Studies ordered: I ordered imaging studies including right knee and tib/fib XR I independently visualized the following imaging with scope of interpretation limited to determining acute life threatening conditions related to emergency care; findings noted above I agree with the radiologist interpretation If any imaging was obtained with contrast I closely monitored patient for any possible adverse reaction a/w contrast administration in the emergency department   Medicines ordered and prescription drug management: Meds ordered this encounter  Medications   acetaminophen  (TYLENOL ) tablet 1,000 mg   oxyCODONE  (Oxy IR/ROXICODONE ) immediate release tablet 5 mg    Refill:  0   HYDROcodone -acetaminophen  (NORCO/VICODIN) 5-325 MG tablet    Sig: Take 2 tablets by mouth every 4 (four) hours as needed.    Dispense:  10 tablet    Refill:  0    -I  have reviewed the patients home medicines and have made adjustments as needed  Reevaluation: After the interventions noted above, I reevaluated the patient and found that they have improved  Co morbidities that complicate the patient evaluation History reviewed. No pertinent past  medical history.    Dispostion: Disposition decision including need for hospitalization was considered, and patient discharged from emergency department.    Final Clinical Impression(s) / ED Diagnoses Final diagnoses:  Acute pain of right knee        Theophilus Pagan, MD 10/04/24 1248    Dreama Longs, MD 10/05/24 1011

## 2024-10-04 NOTE — ED Triage Notes (Signed)
 Pt states heard pop in right knee yesterday  Pain worse with weight bearing States h/o sx on same leg x 2 with hardware

## 2024-11-03 ENCOUNTER — Emergency Department (HOSPITAL_BASED_OUTPATIENT_CLINIC_OR_DEPARTMENT_OTHER)

## 2024-11-03 ENCOUNTER — Encounter (HOSPITAL_BASED_OUTPATIENT_CLINIC_OR_DEPARTMENT_OTHER): Payer: Self-pay | Admitting: Emergency Medicine

## 2024-11-03 ENCOUNTER — Other Ambulatory Visit: Payer: Self-pay

## 2024-11-03 ENCOUNTER — Emergency Department (HOSPITAL_BASED_OUTPATIENT_CLINIC_OR_DEPARTMENT_OTHER)
Admission: EM | Admit: 2024-11-03 | Discharge: 2024-11-03 | Disposition: A | Discharge status: Home / Self Care | Attending: Emergency Medicine | Admitting: Emergency Medicine

## 2024-11-03 DIAGNOSIS — R531 Weakness: Secondary | ICD-10-CM | POA: Insufficient documentation

## 2024-11-03 DIAGNOSIS — E119 Type 2 diabetes mellitus without complications: Secondary | ICD-10-CM | POA: Diagnosis not present

## 2024-11-03 DIAGNOSIS — R5383 Other fatigue: Secondary | ICD-10-CM | POA: Diagnosis not present

## 2024-11-03 DIAGNOSIS — R0789 Other chest pain: Secondary | ICD-10-CM | POA: Diagnosis present

## 2024-11-03 DIAGNOSIS — F17210 Nicotine dependence, cigarettes, uncomplicated: Secondary | ICD-10-CM | POA: Diagnosis not present

## 2024-11-03 DIAGNOSIS — R079 Chest pain, unspecified: Secondary | ICD-10-CM

## 2024-11-03 LAB — TROPONIN T, HIGH SENSITIVITY
Troponin T High Sensitivity: <15 ng/L (ref 0–19)
Troponin T High Sensitivity: <15 ng/L (ref 0–19)

## 2024-11-03 LAB — CBC
HCT: 42.7 % (ref 36.0–46.0)
Hemoglobin: 14.4 g/dL (ref 12.0–15.0)
MCH: 29.6 pg (ref 26.0–34.0)
MCHC: 33.7 g/dL (ref 30.0–36.0)
MCV: 87.9 fL (ref 80.0–100.0)
Platelets: 283 K/uL (ref 150–400)
RBC: 4.86 MIL/uL (ref 3.87–5.11)
RDW: 13 % (ref 11.5–15.5)
WBC: 8.7 K/uL (ref 4.0–10.5)
nRBC: 0 % (ref 0.0–0.2)

## 2024-11-03 LAB — RESP PANEL BY RT-PCR (RSV, FLU A&B, COVID)  RVPGX2
Influenza A by PCR: NEGATIVE
Influenza B by PCR: NEGATIVE
Resp Syncytial Virus by PCR: NEGATIVE
SARS Coronavirus 2 by RT PCR: NEGATIVE

## 2024-11-03 LAB — BASIC METABOLIC PANEL WITH GFR
Anion gap: 8 (ref 5–15)
BUN: 12 mg/dL (ref 6–20)
CO2: 28 mmol/L (ref 22–32)
Calcium: 9 mg/dL (ref 8.9–10.3)
Chloride: 103 mmol/L (ref 98–111)
Creatinine, Ser: 0.59 mg/dL (ref 0.44–1.00)
GFR, Estimated: >60 mL/min
Glucose, Bld: 133 mg/dL — ABNORMAL HIGH (ref 70–99)
Potassium: 4 mmol/L (ref 3.5–5.1)
Sodium: 139 mmol/L (ref 135–145)

## 2024-11-03 MED ORDER — KETOROLAC TROMETHAMINE 15 MG/ML IJ SOLN
15.0000 mg | Freq: Once | INTRAMUSCULAR | Status: AC
  Administered 2024-11-03: 15 mg via INTRAVENOUS
  Filled 2024-11-03: qty 1

## 2024-11-03 NOTE — ED Notes (Signed)
 Patient transported to X-ray

## 2024-11-03 NOTE — ED Provider Notes (Signed)
 Care was taken over from Dr. Geroldine.  Patient presents with chest pain.  Has been going on for the last few days.  It is worse with movement but does not really seem to be exertional in nature.  This morning it woke her up from sleep.  Her EKG does not show any ischemic changes.  She has had 2 negative troponins.  She does not have other symptoms that sound more concerning for aortic dissection or pulmonary embolus.  No hypoxia or shortness of breath.  She was ambulated around the ED and did not have any worsening symptoms.  She has some reproducibility of the pain with palpation of the left chest wall.  Will try some Toradol .  She was discharged home in good condition.  She does have a family history of early heart disease and is concerned about the pain.  Will place an urgent cardiology referral.  Return precautions were given.   Lenor Hollering, MD 11/03/24 1044

## 2024-11-03 NOTE — ED Notes (Signed)
Patient returned from XRAY 

## 2024-11-03 NOTE — ED Triage Notes (Signed)
 PT states feeling bad since Thursday. States CP, nausea fatigue.Pain radiates to left arm and face.

## 2024-11-03 NOTE — Discharge Instructions (Signed)
 Make an appointment to have close follow-up with your primary care doctor.  I also put in a referral for cardiology follow-up.  Return to the emergency room if you have any worsening symptoms.

## 2024-11-03 NOTE — ED Notes (Signed)
 Pt ambulated around unit. Pain level remains the same. EDP notified.

## 2024-11-03 NOTE — ED Provider Notes (Signed)
 " [place] [facility] Provider Note   CSN: [id number] Arrival date & time: 11/03/24  9393     Patient presents with: Chest Pain   Danielle Morales is a 53 y.o. female.  {Add pertinent medical, surgical, social history, OB history to HPI:32947} Patient is a 53 year old female with past medical history of type 2 diabetes not currently on any medications.  Patient presenting today with complaints of chest discomfort.  She has been experiencing weakness, fatigue, and tightness in her chest for the past 2 days.  This is associated with some shortness of breath.  She describes some radiation to the left arm.  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  She denies prior cardiac history, but does report a family history of heart issues.  She has no history of hypertension or hyperlipidemia, but does report smoking cigarettes.       Prior to Admission medications  Medication Sig Start Date End Date Taking? Authorizing Provider  benzonatate  (TESSALON ) 100 MG capsule Take 1 capsule (100 mg total) by mouth every 8 (eight) hours. 09/02/19   Petrucelli, Samantha R, PA-C  fluticasone  (FLONASE ) 50 MCG/ACT nasal spray Place 1 spray into both nostrils daily. 09/02/19   Petrucelli, Samantha R, PA-C  HYDROcodone -acetaminophen  (NORCO/VICODIN) 5-325 MG tablet Take 2 tablets by mouth every 4 (four) hours as needed. 10/04/24   Theophilus Pagan, MD  ibuprofen  (ADVIL ,MOTRIN ) 600 MG tablet Take 1 tablet (600 mg total) by mouth every 6 (six) hours as needed. 07/21/17   Olympia Gee, PA-C  metoCLOPramide  (REGLAN ) 10 MG tablet Take 1 tablet (10 mg total) by mouth every 8 (eight) hours as needed for nausea or vomiting. 04/05/17   Devora Perkins, PA-C  morphine  (MSIR) 15 MG tablet Take 1 tablet (15 mg total) by mouth every 4 (four) hours as needed for severe pain. 10/28/19   Emil Share, DO  pantoprazole  (PROTONIX ) 20 MG tablet Take  1 tablet (20 mg total) by mouth daily. 04/05/17   Devora Perkins, PA-C  predniSONE  (DELTASONE ) 10 MG tablet Take 4 tablets (40 mg total) by mouth daily. 08/31/19   Zackowski, Scott, MD  sucralfate  (CARAFATE ) 1 g tablet Take 1 tablet (1 g total) by mouth 4 (four) times daily -  with meals and at bedtime. 04/05/17   Devora Perkins, PA-C  sulfamethoxazole -trimethoprim  (BACTRIM  DS,SEPTRA  DS) 800-160 MG tablet Take 1 tablet by mouth 2 (two) times daily. X 3 days 04/05/17   Devora Perkins, PA-C  zinc  gluconate 50 MG tablet Take 1 tablet (50 mg total) by mouth daily. 08/31/19   Zackowski, Scott, MD    Allergies: Ciprofloxacin    Review of Systems  All other systems reviewed and are negative.   Updated Vital Signs BP (!) 123/55 (BP Location: Right Arm)   Pulse 77   Temp 97.8 F (36.6 C) (Oral)   Resp 16   Ht 5' 10 (1.778 m)   Wt 127 kg   LMP 06/30/2019   SpO2 97%   BMI 40.18 kg/m   Physical Exam Vitals and nursing note reviewed.  Constitutional:      General: She is not in acute distress.    Appearance: She is well-developed. She is not diaphoretic.  HENT:     Head: Normocephalic and atraumatic.  Cardiovascular:     Rate and Rhythm: Normal rate and regular rhythm.     Heart sounds: No murmur heard.  No friction rub. No gallop.  Pulmonary:     Effort: Pulmonary effort is normal. No respiratory distress.     Breath sounds: Normal breath sounds. No wheezing.  Abdominal:     General: Bowel sounds are normal. There is no distension.     Palpations: Abdomen is soft.     Tenderness: There is no abdominal tenderness.  Musculoskeletal:        General: Normal range of motion.     Cervical back: Normal range of motion and neck supple.     Right lower leg: No tenderness. No edema.     Left lower leg: No tenderness. No edema.  Skin:    General: Skin is warm and dry.  Neurological:     General: No focal deficit present.     Mental Status: She is alert and oriented to person, place, and time.      (all labs ordered are listed, but only abnormal results are displayed) Labs Reviewed  BASIC METABOLIC PANEL WITH GFR  CBC  TROPONIN T, HIGH SENSITIVITY    EKG: EKG Interpretation Date/Time:  Saturday November 03 2024 06:14:28 EST Ventricular Rate:  66 PR Interval:  173 QRS Duration:  83 QT Interval:  396 QTC Calculation: 415 R Axis:   35  Text Interpretation: Sinus rhythm Normal ECG Confirmed by Geroldine Berg ([id number]) on 11/03/2024 6:17:46 AM  Radiology: No results found.  {Document cardiac monitor, telemetry assessment procedure when appropriate:32947} Procedures   Medications Ordered in the ED - No data to display    {Click here for ABCD2, HEART and other calculators REFRESH Note before signing:1}                              Medical Decision Making Amount and/or Complexity of Data Reviewed Labs: ordered. Radiology: ordered.   ***  {Document critical care time when appropriate  Document review of labs and clinical decision tools ie CHADS2VASC2, etc  Document your independent review of radiology images and any outside records  Document your discussion with family members, caretakers and with consultants  Document social determinants of health affecting pt's care  Document your decision making why or why not admission, treatments were needed:32947:::1}   Final diagnoses:  None    ED Discharge Orders     None        "
